# Patient Record
Sex: Female | Born: 1990 | Race: White | Hispanic: No | Marital: Single | Smoking: Never smoker
Health system: Southern US, Community
[De-identification: ages and names within clinical notes are randomized; demographics above are authoritative.]

## PROBLEM LIST (undated history)

## (undated) DIAGNOSIS — J329 Chronic sinusitis, unspecified: Secondary | ICD-10-CM

## (undated) DIAGNOSIS — T7840XA Allergy, unspecified, initial encounter: Secondary | ICD-10-CM

## (undated) DIAGNOSIS — F988 Other specified behavioral and emotional disorders with onset usually occurring in childhood and adolescence: Secondary | ICD-10-CM

## (undated) DIAGNOSIS — J45909 Unspecified asthma, uncomplicated: Secondary | ICD-10-CM

## (undated) DIAGNOSIS — R112 Nausea with vomiting, unspecified: Secondary | ICD-10-CM

## (undated) DIAGNOSIS — Z9889 Other specified postprocedural states: Secondary | ICD-10-CM

## (undated) DIAGNOSIS — E876 Hypokalemia: Secondary | ICD-10-CM

## (undated) HISTORY — DX: Unspecified asthma, uncomplicated: J45.909

## (undated) HISTORY — DX: Allergy, unspecified, initial encounter: T78.40XA

## (undated) HISTORY — DX: Other specified behavioral and emotional disorders with onset usually occurring in childhood and adolescence: F98.8

## (undated) HISTORY — PX: WISDOM TOOTH EXTRACTION: SHX21

## (undated) HISTORY — DX: Hypokalemia: E87.6

## (undated) HISTORY — PX: APPENDECTOMY: SHX54

---

## 2011-01-19 ENCOUNTER — Ambulatory Visit (INDEPENDENT_AMBULATORY_CARE_PROVIDER_SITE_OTHER): Payer: BC Managed Care – PPO

## 2011-01-19 DIAGNOSIS — F988 Other specified behavioral and emotional disorders with onset usually occurring in childhood and adolescence: Secondary | ICD-10-CM

## 2011-04-12 ENCOUNTER — Other Ambulatory Visit: Payer: Self-pay | Admitting: Family Medicine

## 2011-05-02 ENCOUNTER — Encounter: Payer: Self-pay | Admitting: Family Medicine

## 2011-05-02 ENCOUNTER — Ambulatory Visit (INDEPENDENT_AMBULATORY_CARE_PROVIDER_SITE_OTHER): Payer: BC Managed Care – PPO | Admitting: Family Medicine

## 2011-05-02 DIAGNOSIS — F988 Other specified behavioral and emotional disorders with onset usually occurring in childhood and adolescence: Secondary | ICD-10-CM

## 2011-05-02 DIAGNOSIS — J45909 Unspecified asthma, uncomplicated: Secondary | ICD-10-CM

## 2011-05-02 DIAGNOSIS — IMO0001 Reserved for inherently not codable concepts without codable children: Secondary | ICD-10-CM

## 2011-05-02 MED ORDER — ALBUTEROL SULFATE HFA 108 (90 BASE) MCG/ACT IN AERS: 1.0000 | INHALATION_SPRAY | Freq: Four times a day (QID) | RESPIRATORY_TRACT | Status: DC | PRN

## 2011-05-02 MED ORDER — FLUTICASONE-SALMETEROL 250-50 MCG/DOSE IN AEPB: 1.0000 | INHALATION_SPRAY | Freq: Two times a day (BID) | RESPIRATORY_TRACT | Status: DC

## 2011-05-02 MED ORDER — DROSPIRENONE-ETHINYL ESTRADIOL 3-0.02 MG PO TABS: 1.0000 | ORAL_TABLET | Freq: Every day | ORAL | Status: DC

## 2011-05-02 NOTE — Progress Notes (Signed)
  Subjective:    Patient ID: Sherry Estrada, female    DOB: Oct 27, 1990, 21 y.o.   MRN: [id number]  HPI  Patient presents for ADD follow up. No side effects. Denies appetite suppression, tics or emotional lability Helps with focus, attention and concentration  Finishing up with student teaching.  Has job interview tomorrow for Kindergarten position  Asthma- using Advair twice daily.  Recently with exercise intolerance.  Also has deveolped allergy symptoms.  Ear pressure, PND and cough.  Needs refill of OCP; In monogamous relationship  Review of Systems     Objective:   Physical Exam  Constitutional: She appears well-developed.  HENT:  Mouth/Throat: Oropharynx is clear and moist.  Neck: Neck supple. No thyromegaly present.  Cardiovascular: Normal rate, regular rhythm and normal heart sounds.   Pulmonary/Chest: Effort normal. Wheezes: prolong expiratory phase.  Lymphadenopathy:    She has no cervical adenopathy.  Neurological: She is alert.  Skin: Skin is warm.      Spirometry FEV1, FEV 25-75 normal    Assessment & Plan:   1. Asthma  Spirometry with graph, albuterol (PROVENTIL HFA;VENTOLIN HFA) 108 (90 BASE) MCG/ACT inhaler, Fluticasone-Salmeterol (ADVAIR DISKUS) 250-50 MCG/DOSE AEPB  2. Birth control  drospirenone-ethinyl estradiol (GIANVI) 3-0.02 MG tablet  3. ADD (attention deficit disorder)  Patient given 6 post dated prescriptions of Adderall  25 mg daily.  We will see her back in 6 months.  Prescriptions have been scanned into electronic record.

## 2011-05-17 ENCOUNTER — Other Ambulatory Visit: Payer: Self-pay | Admitting: Physician Assistant

## 2011-06-16 ENCOUNTER — Other Ambulatory Visit: Payer: Self-pay | Admitting: Physician Assistant

## 2011-07-05 ENCOUNTER — Ambulatory Visit (INDEPENDENT_AMBULATORY_CARE_PROVIDER_SITE_OTHER): Payer: BC Managed Care – PPO | Admitting: Emergency Medicine

## 2011-07-05 VITALS — BP 119/71 | HR 91 | Temp 98.0°F | Resp 18 | Ht 66.5 in | Wt 138.0 lb

## 2011-07-05 DIAGNOSIS — J309 Allergic rhinitis, unspecified: Secondary | ICD-10-CM

## 2011-07-05 MED ORDER — MOMETASONE FUROATE 50 MCG/ACT NA SUSP: 2.0000 | Freq: Every day | NASAL | Status: AC

## 2011-07-05 NOTE — Progress Notes (Signed)
  Subjective:    Patient ID: Sherry Estrada, female    DOB: January 28, 1991, 21 y.o.   MRN: [id number]  HPI Comments: Long history of SAR with acute nasal congestion unable to breath through nose.  No fever or chills.  No purulent discharge all clear     Review of Systems  Constitutional: Negative.   HENT: Positive for congestion, rhinorrhea and postnasal drip.   Eyes: Negative.   Respiratory: Negative.   Cardiovascular: Negative.   Musculoskeletal: Negative.   Neurological: Negative.        Objective:   Physical Exam  Constitutional: She is oriented to person, place, and time. She appears well-developed and well-nourished.  HENT:  Head: Normocephalic and atraumatic.  Right Ear: External ear normal.  Left Ear: External ear normal.  Nose: Mucosal edema and rhinorrhea present.  Eyes: Conjunctivae and EOM are normal. Pupils are equal, round, and reactive to light. Right eye exhibits no discharge. Left eye exhibits no discharge.  Neck: Normal range of motion. Neck supple.  Cardiovascular: Normal rate, regular rhythm and normal heart sounds.   Pulmonary/Chest: Effort normal and breath sounds normal.  Abdominal: Soft.  Musculoskeletal: Normal range of motion.  Neurological: She is alert and oriented to person, place, and time. She has normal reflexes.  Skin: Skin is warm.          Assessment & Plan:  Continue OTC allegra and start nasal steroid spray.  Avoid aftrin

## 2011-07-05 NOTE — Patient Instructions (Signed)
Allergic Rhinitis  Allergic rhinitis is when the mucous membranes in the nose respond to allergens. Allergens are particles in the air that cause your body to have an allergic reaction. This causes you to release allergic antibodies. Through a chain of events, these eventually cause you to release histamine into the blood stream (hence the use of antihistamines). Although meant to be protective to the body, it is this release that causes your discomfort, such as frequent sneezing, congestion and an itchy runny nose.    CAUSES    The pollen allergens may come from grasses, trees, and weeds. This is seasonal allergic rhinitis, or "hay fever." Other allergens cause year-round allergic rhinitis (perennial allergic rhinitis) such as house dust mite allergen, pet dander and mold spores.    SYMPTOMS     Nasal stuffiness (congestion).   Runny, itchy nose with sneezing and tearing of the eyes.   There is often an itching of the mouth, eyes and ears.  It cannot be cured, but it can be controlled with medications.  DIAGNOSIS    If you are unable to determine the offending allergen, skin or blood testing may find it.  TREATMENT     Avoid the allergen.   Medications and allergy shots (immunotherapy) can help.   Hay fever may often be treated with antihistamines in pill or nasal spray forms. Antihistamines block the effects of histamine. There are over-the-counter medicines that may help with nasal congestion and swelling around the eyes. Check with your caregiver before taking or giving this medicine.  If the treatment above does not work, there are many new medications your caregiver can prescribe. Stronger medications may be used if initial measures are ineffective. Desensitizing injections can be used if medications and avoidance fails. Desensitization is when a patient is given ongoing shots until the body becomes less sensitive to the allergen. Make sure you follow up with your caregiver if problems continue.  SEEK  MEDICAL CARE IF:     You develop fever (more than 100.5 F (38.1 C).   You develop a cough that does not stop easily (persistent).   You have shortness of breath.   You start wheezing.   Symptoms interfere with normal daily activities.  Document Released: 10/12/2000 Document Revised: 01/06/2011 Document Reviewed: 04/23/2008  ExitCare Patient Information 2012 [employer].

## 2011-08-23 ENCOUNTER — Encounter: Payer: Self-pay | Admitting: Family Medicine

## 2011-09-28 ENCOUNTER — Encounter: Payer: Self-pay | Admitting: Physician Assistant

## 2011-09-28 DIAGNOSIS — J309 Allergic rhinitis, unspecified: Secondary | ICD-10-CM | POA: Insufficient documentation

## 2011-11-06 ENCOUNTER — Ambulatory Visit (INDEPENDENT_AMBULATORY_CARE_PROVIDER_SITE_OTHER): Payer: BC Managed Care – PPO | Admitting: Family Medicine

## 2011-11-06 VITALS — BP 119/78 | HR 75 | Temp 98.0°F | Resp 16 | Ht 66.5 in | Wt 144.4 lb

## 2011-11-06 DIAGNOSIS — F988 Other specified behavioral and emotional disorders with onset usually occurring in childhood and adolescence: Secondary | ICD-10-CM | POA: Insufficient documentation

## 2011-11-06 MED ORDER — AMPHETAMINE-DEXTROAMPHET ER 25 MG PO CP24: 25.0000 mg | ORAL_CAPSULE | ORAL | Status: DC

## 2011-11-06 NOTE — Progress Notes (Signed)
 [facility]:  Office Visit  Chief Complaint:  Chief Complaint  Patient presents with  . Medication Refill    need Adderall    HPI: Sherry Estrada is a 21 y.o. female who complains of  ADD refills. Has been on same dose for 25 mg for the 2-3 years. 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 Doing well with focus and getting job done at this dose. No SEs. No weight loss, palpitations; denies anxiety/depression, SI/HI with this med  Past Medical History  Diagnosis Date  . ADD (attention deficit disorder)    Past Surgical History  Procedure Date  . Appendectomy    History   Social History  . Marital Status: Single    Spouse Name: N/A    Number of Children: N/A  . Years of Education: N/A   Social History Main Topics  . Smoking status: Never Smoker   . Smokeless tobacco: None  . Alcohol Use: Yes     ocas  . Drug Use: No  . Sexually Active: None   Other Topics Concern  . None   Social History Narrative  . None   Family History  Problem Relation Age of Onset  . Cancer Mother    No Known Allergies Prior to Admission medications   Medication Sig Start Date End Date Taking? Authorizing Provider  albuterol (PROVENTIL HFA;VENTOLIN HFA) 108 (90 BASE) MCG/ACT inhaler Inhale 1 puff into the lungs every 6 (six) hours as needed for wheezing. 05/02/11 05/01/12 Yes Dois Davenport, MD  amphetamine-dextroamphetamine (ADDERALL XR) 25 MG 24 hr capsule Take 25 mg by mouth every morning.   Yes Historical Provider, MD  drospirenone-ethinyl estradiol (GIANVI) 3-0.02 MG tablet Take 1 tablet by mouth daily. 05/02/11  Yes Dois Davenport, MD  EPINEPHrine (EPIPEN JR) 0.15 MG/0.3ML injection Inject 0.15 mg into the muscle as needed.   Yes Historical Provider, MD  Fluticasone-Salmeterol (ADVAIR DISKUS) 250-50 MCG/DOSE AEPB Inhale 1 puff into the lungs every 12 (twelve) hours. 05/02/11  Yes Dois Davenport, MD  mometasone (NASONEX) 50 MCG/ACT nasal spray Place 2 sprays into the nose  daily. 07/05/11 07/04/12 Yes Phillips Odor, MD     ROS: The patient denies fevers, chills, night sweats, unintentional weight loss, chest pain, palpitations, wheezing, dyspnea on exertion, nausea, vomiting, abdominal pain, dysuria, hematuria, melena, numbness, weakness, or tingling.   All other systems have been reviewed and were otherwise negative with the exception of those mentioned in the HPI and as above.    PHYSICAL EXAM: Filed Vitals:   11/06/11 0859  BP: 119/78  Pulse: 75  Temp: 98 F (36.7 C)  Resp: 16   Filed Vitals:   11/06/11 0859  Height: 5' 6.5" (1.689 m)  Weight: 144 lb 6.4 oz (65.499 kg)   Body mass index is 22.96 kg/(m^2).  General: Alert, no acute distress HEENT:  Normocephalic, atraumatic, oropharynx patent.  Cardiovascular:  Regular rate and rhythm, no rubs murmurs or gallops.  No Carotid bruits, radial pulse intact. No pedal edema.  Respiratory: Clear to auscultation bilaterally.  No wheezes, rales, or rhonchi.  No cyanosis, no use of accessory musculature GI: No organomegaly, abdomen is soft and non-tender, positive bowel sounds.  No masses. Skin: No rashes. Neurologic: Facial musculature symmetric. Psychiatric: Patient is appropriate throughout our interaction. Lymphatic: No cervical lymphadenopathy Musculoskeletal: Gait intact.   LABS: No results found for this or any previous visit.   EKG/XRAY:   Primary read interpreted by Dr. Conley Rolls at [facility].  ASSESSMENT/PLAN: Encounter Diagnosis  Name Primary?  . ADD (attention deficit disorder) Yes   Refilled Adderrall XR 25 mg daily for 10/6, 11/6, 12/6, 1/6, 2/6, and 3/6. #30. No refills.  F/u in 6 months.    ,  PHUONG, DO 11/06/2011 10:19 AM

## 2011-11-30 ENCOUNTER — Ambulatory Visit (INDEPENDENT_AMBULATORY_CARE_PROVIDER_SITE_OTHER): Payer: BC Managed Care – PPO | Admitting: Family Medicine

## 2011-11-30 VITALS — BP 126/74 | HR 97 | Temp 98.5°F | Resp 17 | Ht 66.5 in | Wt 146.0 lb

## 2011-11-30 DIAGNOSIS — M545 Low back pain: Secondary | ICD-10-CM

## 2011-11-30 MED ORDER — MELOXICAM 15 MG PO TABS: 15.0000 mg | ORAL_TABLET | Freq: Every day | ORAL | Status: DC

## 2011-11-30 MED ORDER — CYCLOBENZAPRINE HCL 5 MG PO TABS: 5.0000 mg | ORAL_TABLET | Freq: Three times a day (TID) | ORAL | Status: DC | PRN

## 2011-11-30 NOTE — Progress Notes (Addendum)
Subjective: Patient is coming in with 3 day history of back pain does not remember injury.  Patient does have PmHx of herniated disc multiple years ago when she did cheerleading/gymnastcis.  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  Seems that her low back is very tight at this time.  Hurt more with movement, call it some type of dull ache with sharp pain but no radiation, more on right side. No weakness or numbness in extremity, no weight loss fever or chills. No Dysuria and no hx of kidney stones.   Patient also needs a TB screening test for her employment while here. She is starting a new job working for [employer].    Objective: Blood pressure 126/74, pulse 97, temperature 98.5 F (36.9 C), temperature source Oral, resp. rate 17, height 5' 6.5" (1.689 m), weight 146 lb (66.225 kg), last menstrual period 11/23/2011, SpO2 98.00%. General: No apparent distress alert and oriented x3 mood and affect are normal Respiratory: Patient speaks in full sentences and does not appear short of breath Skin: Warm dry intact with no signs of infection or rash. Neuro: Cranial nerves II through XII intact or vascular intact in all extremities with 2+ DTRs are symmetric Back Exam: On inspection patient does have some mild dextroscoliosis of the thoracic spine. Patient has full flexion extension as well as side bending. Patient has a negative straight leg test bilaterally. Patient has a negative Faber's test bilaterally. Sensation is intact distally. Patient has 5 out of 5 strength of plantar flexion dorsiflexion as well as quadriceps and hamstrings bilaterally. Patient has tenderness to palpation over the paraspinal musculature of the lumbar spine near L4-L5 mostly on the right side. No spinous process tenderness. Gait is guarded but non-antalgic   Assessment: Low back pain secondary to muscle spasm.  Plan: Patient has what appears to be mechanical back pain  without radiculopathy. Patient has some pain over the L5 region but once again no radiculopathy with negative straight leg test and negative Faber's test. Patient is able to do activities of daily living without any trouble and has no red flags such as fevers chills or weight loss. Patient at this time will be treated conservatively with anti-inflammatories and muscle relaxants. Patient given a home exercise program to help with increasing range of motion as well as core strengthening. Patient will try this for 2 weeks and if no improvement will come back for further evaluation. At that time I would consider getting imaging to further evaluate. Patient does have a past medical history significant for herniated disc but this I agree that this is not a recurrent herniated disc.

## 2011-11-30 NOTE — Patient Instructions (Signed)
Very nice to meet you. I'm giving you to medicines. One is called meloxicam. Take one pill daily for the next 10 days as needed thereafter. If it starts to hurt your stomach please stop the medication. I'm also giving you a medicine called Flexeril. Take one pill up to 3 times a day for muscle spasm. Please do the exercises I'm giving you one to 2 times daily. If not better in 2 weeks please come back for reevaluation.

## 2011-11-30 NOTE — Progress Notes (Signed)
Tuberculosis Risk Questionnaire  1. Were you born outside the [place] in one of the following parts of the world:    [place], [place], [place], [place] America or Eastern [place]?  No  2. Have you traveled outside the [place] and lived for more than one month in one of the following parts of the world:  [place], [place], [place], [place] or Eastern [place]?  No  3. Do you have a compromised immune system such as from any of the following conditions:  HIV/AIDS, organ or bone marrow transplantation, diabetes, immunosuppressive   medicines (e.g. Prednisone, Remicaide), leukemia, lymphoma, cancer of the   head or neck, gastrectomy or jejunal bypass, end-stage renal disease (on   dialysis), or silicosis?  No    4. Have you ever done one of the following:    Used crack cocaine, injected illegal drugs, worked or resided in jail or prison,   worked or resided at a homeless shelter, or worked as a healthcare worker in   direct contact with patients?  No  5. Have you ever been exposed to anyone with infectious tuberculosis?  No Tuberculosis Symptom Questionnaire  Do you currently have any of the following symptoms?  1. Unexplained cough lasting more than 3 weeks? No  Unexplained fever lasting more than 3 weeks. No   3. Night Sweats (sweating that leaves the bedclothes and sheets wet)   No  4. Shortness of Breath No  5. Chest Pain No  6. Unintentional weight loss  No  7. Unexplained fatigue (very tired for no reason) No 

## 2011-12-07 ENCOUNTER — Telehealth: Payer: Self-pay

## 2011-12-07 NOTE — Telephone Encounter (Signed)
Patient has now found them.

## 2011-12-07 NOTE — Telephone Encounter (Signed)
pts mom cleaned her apartment and she cannot find the scripts (five of them to get them filled) She will continue to search , but needs a new prescription for amphetamine-dextroamphetamine (ADDERALL XR) 25 MG 24 hr capsule [phone]

## 2011-12-08 NOTE — Progress Notes (Signed)
Completed prior auth over the phone for pt's Adderall XR 25 and received approval from 11/17/11 to 12/07/12 case #[id number]. Faxed approval notice to pharmacy.

## 2012-05-03 ENCOUNTER — Telehealth: Payer: Self-pay

## 2012-05-03 ENCOUNTER — Other Ambulatory Visit: Payer: Self-pay | Admitting: Radiology

## 2012-05-03 DIAGNOSIS — J45909 Unspecified asthma, uncomplicated: Secondary | ICD-10-CM

## 2012-05-03 MED ORDER — FLUTICASONE-SALMETEROL 250-50 MCG/DOSE IN AEPB: 1.0000 | INHALATION_SPRAY | Freq: Two times a day (BID) | RESPIRATORY_TRACT | Status: DC

## 2012-05-03 NOTE — Telephone Encounter (Signed)
Pt's father requests RF of pt's Advair. Pt is due for f/up on ADD med in April anyway and she will D/W provider her asthma at OV. Sending in a RF to [facility]

## 2012-05-03 NOTE — Telephone Encounter (Signed)
rc'd fax from pharmacy for Advair, please advise, pended

## 2012-05-15 ENCOUNTER — Other Ambulatory Visit: Payer: Self-pay | Admitting: Allergy and Immunology

## 2012-05-15 ENCOUNTER — Ambulatory Visit
Admission: RE | Admit: 2012-05-15 | Discharge: 2012-05-15 | Disposition: A | Discharge status: Home / Self Care | Payer: BC Managed Care – PPO | Source: Ambulatory Visit | Attending: Allergy and Immunology | Admitting: Allergy and Immunology

## 2012-05-15 DIAGNOSIS — J019 Acute sinusitis, unspecified: Secondary | ICD-10-CM

## 2012-05-21 ENCOUNTER — Ambulatory Visit (INDEPENDENT_AMBULATORY_CARE_PROVIDER_SITE_OTHER): Payer: BC Managed Care – PPO | Admitting: Family Medicine

## 2012-05-21 VITALS — BP 112/70 | HR 60 | Temp 98.2°F | Resp 12 | Ht 66.5 in | Wt 143.0 lb

## 2012-05-21 DIAGNOSIS — IMO0001 Reserved for inherently not codable concepts without codable children: Secondary | ICD-10-CM

## 2012-05-21 DIAGNOSIS — J309 Allergic rhinitis, unspecified: Secondary | ICD-10-CM

## 2012-05-21 DIAGNOSIS — J45909 Unspecified asthma, uncomplicated: Secondary | ICD-10-CM

## 2012-05-21 DIAGNOSIS — Z309 Encounter for contraceptive management, unspecified: Secondary | ICD-10-CM

## 2012-05-21 DIAGNOSIS — F988 Other specified behavioral and emotional disorders with onset usually occurring in childhood and adolescence: Secondary | ICD-10-CM

## 2012-05-21 MED ORDER — AMPHETAMINE-DEXTROAMPHET ER 25 MG PO CP24: 25.0000 mg | ORAL_CAPSULE | ORAL | Status: DC

## 2012-05-21 MED ORDER — DROSPIRENONE-ETHINYL ESTRADIOL 3-0.02 MG PO TABS: 1.0000 | ORAL_TABLET | Freq: Every day | ORAL | Status: DC

## 2012-05-21 MED ORDER — AMPHETAMINE-DEXTROAMPHET ER 25 MG PO CP24
25.0000 mg | ORAL_CAPSULE | ORAL | Status: DC
Start: 2012-05-21 — End: 2012-05-21

## 2012-05-21 MED ORDER — FLUTICASONE-SALMETEROL 250-50 MCG/DOSE IN AEPB: 1.0000 | INHALATION_SPRAY | Freq: Two times a day (BID) | RESPIRATORY_TRACT | Status: DC

## 2012-05-21 NOTE — Progress Notes (Signed)
Sherry Estrada is a 22 y.o. female who presents to [facility] today with several concerns:  1.  ADHD:  Present since high school, has been on same dosage of medication since then.  Currently works as a Runner, broadcasting/film/video, states this helps her with her symptoms of inattentiveness and easy distractibility.  Can tell a difference when not using this medication.  No abdominal discomfort, chest pain, changes in weight with this medication.    2.  Allergic rhinitis:  Chronic problem.  Followed by allergist for this.  Recently diagnosed with chronic sinusitis secondary to CT scan findings and symptoms.  Started on antibiotic by her allergist with much improvement.  Currently still with some purulent drainage when she blows her nose and foul smell.  No fevers or chills.    3.  Contraception:  Desires refill for her birth control pills.  On this for contraception.  No leg swelling.  Sexually active one partner, no vaginal discharge, no concern for STI's.    4.  Asthma:  Controlled with Advair currently.  Only requires rescue inhaler 1-2 times every few weeks.  No night-time awakenings, no cough.  No dyspnea.   Not currently short of breath, not worse with seasonal allergies.  No chest pain, palpitations.  Never been hospitalized for this, no intubations.    PMH reviewed.  Past Medical History  Diagnosis Date  . ADD (attention deficit disorder)   . Allergy   . Asthma    Past Surgical History  Procedure Laterality Date  . Appendectomy      Medications reviewed. Current Outpatient Prescriptions  Medication Sig Dispense Refill  . amphetamine-dextroamphetamine (ADDERALL XR) 25 MG 24 hr capsule Take 1 capsule (25 mg total) by mouth every morning. Refill only on 04/06/2011  30 capsule  0  . azelastine (ASTELIN) 137 MCG/SPRAY nasal spray Place 1 spray into the nose 2 (two) times daily. Use in each nostril as directed      . drospirenone-ethinyl estradiol (GIANVI) 3-0.02 MG tablet Take 1 tablet by mouth daily.  28  tablet  11  . EPINEPHrine (EPIPEN JR) 0.15 MG/0.3ML injection Inject 0.15 mg into the muscle as needed.      . fluconazole (DIFLUCAN) 150 MG tablet Take 150 mg by mouth once.      . Fluticasone-Salmeterol (ADVAIR DISKUS) 250-50 MCG/DOSE AEPB Inhale 1 puff into the lungs every 12 (twelve) hours.  60 each  6  . levocetirizine (XYZAL) 5 MG tablet Take 5 mg by mouth every evening.      . moxifloxacin (AVELOX) 400 MG tablet Take 400 mg by mouth daily.      . predniSONE (DELTASONE) 10 MG tablet Take 10 mg by mouth daily.      Marland Kitchen albuterol (PROVENTIL HFA;VENTOLIN HFA) 108 (90 BASE) MCG/ACT inhaler Inhale 1 puff into the lungs every 6 (six) hours as needed for wheezing.  1 Inhaler  3  . cyclobenzaprine (FLEXERIL) 5 MG tablet Take 1 tablet (5 mg total) by mouth 3 (three) times daily as needed for muscle spasms.  30 tablet  1  . meloxicam (MOBIC) 15 MG tablet Take 1 tablet (15 mg total) by mouth daily.  30 tablet  0  . mometasone (NASONEX) 50 MCG/ACT nasal spray Place 2 sprays into the nose daily.  17 g  12   No current facility-administered medications for this visit.    ROS as above otherwise neg.  No chest pain, palpitations, SOB, Fever, Chills, Abd pain, N/V/D.   Physical Exam:  BP 112/70  Pulse 60  Temp(Src) 98.2 F (36.8 C) (Oral)  Resp 12  Ht 5' 6.5" (1.689 m)  Wt 143 lb (64.864 kg)  BMI 22.74 kg/m2  SpO2 98%  LMP 05/15/2012 Gen:  Patient sitting on exam table, appears stated age in no acute distress Head: Normocephalic atraumatic Eyes: EOMI, PERRL, sclera and conjunctiva non-erythematous Ears:  Canals clear bilaterally.  TMs pearly gray bilaterally without erythema or bulging.   Nose:  Nasal turbinates minimally enlarged.  Maxillary tenderness present BL. No foreign bodies or nasal polyps noted.   Mouth: Mucosa membranes moist. Tonsils +2, nonenlarged, non-erythematous. Neck: No cervical lymphadenopathy noted Heart:  RRR. Pulm:  Clear to auscultation bilaterally with good air  movement.  No wheezes or rales noted.   Psych:  Pleasant, converstant, not anxious or depressed appearing.     Assessment and Plan:  1.  Birth control:   Refill for present contraceptive.  Non smoker.  Doesn't miss pills.  2.  ADHD:  - Refill for Adderall provided for next 6 months.  She works at [employer] through summer as well.  3.  Chronic allergic rhinitis/sinusitis: - Followed by allergist.   - Already has an appt with ENT.   - Nothing acute currently, recommended FU with ENT.    4.  Ashthma: - Intermittent, controlled with Advair-  _ no recent exacerbations - Refill for advair sent, does not need rescue inhaler refills.

## 2012-10-24 ENCOUNTER — Encounter (HOSPITAL_BASED_OUTPATIENT_CLINIC_OR_DEPARTMENT_OTHER): Payer: Self-pay | Admitting: *Deleted

## 2012-10-29 ENCOUNTER — Encounter (HOSPITAL_BASED_OUTPATIENT_CLINIC_OR_DEPARTMENT_OTHER)
Admission: RE | Disposition: A | Discharge status: Home / Self Care | Payer: Self-pay | Source: Ambulatory Visit | Attending: Otolaryngology

## 2012-10-29 ENCOUNTER — Encounter (HOSPITAL_BASED_OUTPATIENT_CLINIC_OR_DEPARTMENT_OTHER): Payer: Self-pay | Admitting: Anesthesiology

## 2012-10-29 ENCOUNTER — Ambulatory Visit (HOSPITAL_BASED_OUTPATIENT_CLINIC_OR_DEPARTMENT_OTHER)
Admission: RE | Admit: 2012-10-29 | Discharge: 2012-10-29 | Disposition: A | Discharge status: Home / Self Care | Payer: BC Managed Care – PPO | Source: Ambulatory Visit | Attending: Otolaryngology | Admitting: Otolaryngology

## 2012-10-29 ENCOUNTER — Encounter (HOSPITAL_BASED_OUTPATIENT_CLINIC_OR_DEPARTMENT_OTHER): Payer: Self-pay | Admitting: *Deleted

## 2012-10-29 ENCOUNTER — Ambulatory Visit (HOSPITAL_BASED_OUTPATIENT_CLINIC_OR_DEPARTMENT_OTHER): Payer: BC Managed Care – PPO | Admitting: Anesthesiology

## 2012-10-29 DIAGNOSIS — J320 Chronic maxillary sinusitis: Secondary | ICD-10-CM | POA: Insufficient documentation

## 2012-10-29 DIAGNOSIS — Z9889 Other specified postprocedural states: Secondary | ICD-10-CM

## 2012-10-29 DIAGNOSIS — J322 Chronic ethmoidal sinusitis: Secondary | ICD-10-CM | POA: Insufficient documentation

## 2012-10-29 DIAGNOSIS — J343 Hypertrophy of nasal turbinates: Secondary | ICD-10-CM | POA: Insufficient documentation

## 2012-10-29 DIAGNOSIS — J338 Other polyp of sinus: Secondary | ICD-10-CM | POA: Insufficient documentation

## 2012-10-29 HISTORY — DX: Chronic sinusitis, unspecified: J32.9

## 2012-10-29 HISTORY — PX: TURBINATE REDUCTION: SHX6157

## 2012-10-29 HISTORY — DX: Other specified postprocedural states: Z98.890

## 2012-10-29 HISTORY — PX: NASAL SINUS SURGERY: SHX719

## 2012-10-29 HISTORY — DX: Nausea with vomiting, unspecified: R11.2

## 2012-10-29 SURGERY — REDUCTION, NASAL TURBINATE
Anesthesia: General | Site: Ear | Laterality: Bilateral | Wound class: Clean Contaminated

## 2012-10-29 MED ORDER — MIDAZOLAM HCL 2 MG/2ML IJ SOLN: 1.0000 mg | INTRAMUSCULAR | Status: DC | PRN

## 2012-10-29 MED ORDER — OXYCODONE-ACETAMINOPHEN 5-325 MG PO TABS: 1.0000 | ORAL_TABLET | ORAL | Status: DC | PRN

## 2012-10-29 MED ORDER — LIDOCAINE HCL (CARDIAC) 20 MG/ML IV SOLN
INTRAVENOUS | Status: DC | PRN
  Administered 2012-10-29: 50 mg via INTRAVENOUS

## 2012-10-29 MED ORDER — FENTANYL CITRATE 0.05 MG/ML IJ SOLN
INTRAMUSCULAR | Status: DC | PRN
  Administered 2012-10-29: 100 ug via INTRAVENOUS
  Administered 2012-10-29 (×3): 50 ug via INTRAVENOUS

## 2012-10-29 MED ORDER — FENTANYL CITRATE 0.05 MG/ML IJ SOLN: 50.0000 ug | INTRAMUSCULAR | Status: DC | PRN

## 2012-10-29 MED ORDER — ONDANSETRON HCL 4 MG/2ML IJ SOLN
INTRAMUSCULAR | Status: DC | PRN
  Administered 2012-10-29: 4 mg via INTRAVENOUS

## 2012-10-29 MED ORDER — AMOXICILLIN 875 MG PO TABS: 875.0000 mg | ORAL_TABLET | Freq: Two times a day (BID) | ORAL | Status: AC

## 2012-10-29 MED ORDER — DEXAMETHASONE SODIUM PHOSPHATE 4 MG/ML IJ SOLN
INTRAMUSCULAR | Status: DC | PRN
  Administered 2012-10-29: 10 mg via INTRAVENOUS

## 2012-10-29 MED ORDER — MIDAZOLAM HCL 5 MG/5ML IJ SOLN
INTRAMUSCULAR | Status: DC | PRN
  Administered 2012-10-29: 2 mg via INTRAVENOUS

## 2012-10-29 MED ORDER — PROPOFOL 10 MG/ML IV BOLUS
INTRAVENOUS | Status: DC | PRN
  Administered 2012-10-29: 150 mg via INTRAVENOUS

## 2012-10-29 MED ORDER — SUCCINYLCHOLINE CHLORIDE 20 MG/ML IJ SOLN
INTRAMUSCULAR | Status: DC | PRN
  Administered 2012-10-29: 100 mg via INTRAVENOUS

## 2012-10-29 MED ORDER — OXYCODONE HCL 5 MG/5ML PO SOLN: 5.0000 mg | Freq: Once | ORAL | Status: DC | PRN

## 2012-10-29 MED ORDER — COCAINE HCL 4 % EX SOLN
CUTANEOUS | Status: DC | PRN
  Administered 2012-10-29: 4 mL via NASAL

## 2012-10-29 MED ORDER — LACTATED RINGERS IV SOLN
INTRAVENOUS | Status: DC
  Administered 2012-10-29: 10:00:00 via INTRAVENOUS
  Administered 2012-10-29: 20 mL/h via INTRAVENOUS
  Administered 2012-10-29: 08:00:00 via INTRAVENOUS

## 2012-10-29 MED ORDER — OXYCODONE HCL 5 MG PO TABS: 5.0000 mg | ORAL_TABLET | Freq: Once | ORAL | Status: DC | PRN

## 2012-10-29 MED ORDER — OXYMETAZOLINE HCL 0.05 % NA SOLN
NASAL | Status: DC | PRN
  Administered 2012-10-29: 1 via NASAL

## 2012-10-29 MED ORDER — HYDROMORPHONE HCL PF 1 MG/ML IJ SOLN
0.2500 mg | INTRAMUSCULAR | Status: DC | PRN
  Administered 2012-10-29 (×3): 0.5 mg via INTRAVENOUS

## 2012-10-29 MED ORDER — CEFAZOLIN SODIUM-DEXTROSE 2-3 GM-% IV SOLR
INTRAVENOUS | Status: DC | PRN
  Administered 2012-10-29: 2 g via INTRAVENOUS

## 2012-10-29 MED ORDER — LIDOCAINE-EPINEPHRINE 1 %-1:100000 IJ SOLN
INTRAMUSCULAR | Status: DC | PRN
  Administered 2012-10-29: 6.5 mL

## 2012-10-29 MED ORDER — BACITRACIN ZINC 500 UNIT/GM EX OINT
TOPICAL_OINTMENT | CUTANEOUS | Status: DC | PRN
  Administered 2012-10-29: 1 via TOPICAL

## 2012-10-29 MED ORDER — PROMETHAZINE HCL 25 MG/ML IJ SOLN
6.2500 mg | INTRAMUSCULAR | Status: DC | PRN
  Administered 2012-10-29: 6.25 mg via INTRAVENOUS

## 2012-10-29 SURGICAL SUPPLY — 45 items
ATTRACTOMAT 16X20 MAGNETIC DRP (DRAPES) IMPLANT
BLADE RAD40 ROTATE 4M 4 5PK (BLADE) IMPLANT
BLADE RAD60 ROTATE M4 4 5PK (BLADE) IMPLANT
BLADE ROTATE RAD12 5PK M4 4MM (BLADE) IMPLANT
BLADE TRICUT ROTATE M4 4 5PK (BLADE) ×1 IMPLANT
BUR HS RAD FRONTAL 3 (BURR) IMPLANT
CANISTER SUC SOCK COL 7 IN (MISCELLANEOUS) ×4 IMPLANT
CANISTER SUCTION 1200CC (MISCELLANEOUS) ×2 IMPLANT
CLOTH BEACON ORANGE TIMEOUT ST (SAFETY) ×2 IMPLANT
COAGULATOR SUCT 8FR VV (MISCELLANEOUS) IMPLANT
COAGULATOR SUCT SWTCH 10FR 6 (ELECTROSURGICAL) IMPLANT
DECANTER SPIKE VIAL GLASS SM (MISCELLANEOUS) ×1 IMPLANT
DRSG NASAL KENNEDY LMNT 8CM (GAUZE/BANDAGES/DRESSINGS) IMPLANT
DRSG NASOPORE 8CM (GAUZE/BANDAGES/DRESSINGS) ×2 IMPLANT
DRSG TELFA 3X8 NADH (GAUZE/BANDAGES/DRESSINGS) IMPLANT
ELECT REM PT RETURN 9FT ADLT (ELECTROSURGICAL) ×2
ELECTRODE REM PT RTRN 9FT ADLT (ELECTROSURGICAL) ×1 IMPLANT
GLOVE BIO SURGEON STRL SZ7.5 (GLOVE) ×2 IMPLANT
GLOVE SURG SS PI 7.0 STRL IVOR (GLOVE) ×1 IMPLANT
GOWN PREVENTION PLUS XLARGE (GOWN DISPOSABLE) ×3 IMPLANT
HEMOSTAT SURGICEL 2X14 (HEMOSTASIS) IMPLANT
IV NS 500ML (IV SOLUTION) ×2
IV NS 500ML BAXH (IV SOLUTION) ×1 IMPLANT
NDL HYPO 25X1 1.5 SAFETY (NEEDLE) ×1 IMPLANT
NDL SPNL 25GX3.5 QUINCKE BL (NEEDLE) IMPLANT
NEEDLE 27GAX1X1/2 (NEEDLE) ×2 IMPLANT
NEEDLE HYPO 25X1 1.5 SAFETY (NEEDLE) IMPLANT
NEEDLE SPNL 25GX3.5 QUINCKE BL (NEEDLE) IMPLANT
NS IRRIG 1000ML POUR BTL (IV SOLUTION) ×1 IMPLANT
PACK BASIN DAY SURGERY FS (CUSTOM PROCEDURE TRAY) ×2 IMPLANT
PACK ENT DAY SURGERY (CUSTOM PROCEDURE TRAY) ×2 IMPLANT
PAD DRESSING TELFA 3X8 NADH (GAUZE/BANDAGES/DRESSINGS) IMPLANT
SET EXT MALE ROTATING LL 32IN (MISCELLANEOUS) IMPLANT
SET IV EXT TUBING FEMALE 31 (MISCELLANEOUS) IMPLANT
SLEEVE SCD COMPRESS KNEE MED (MISCELLANEOUS) ×1 IMPLANT
SOLUTION BUTLER CLEAR DIP (MISCELLANEOUS) ×3 IMPLANT
SPLINT NASAL DOYLE BI-VL (GAUZE/BANDAGES/DRESSINGS) IMPLANT
SPONGE GAUZE 2X2 8PLY STRL LF (GAUZE/BANDAGES/DRESSINGS) ×2 IMPLANT
SPONGE NEURO XRAY DETECT 1X3 (DISPOSABLE) ×2 IMPLANT
SUT ETHILON 3 0 PS 1 (SUTURE) IMPLANT
SUT PLAIN 4 0 ~~LOC~~ 1 (SUTURE) IMPLANT
SUT PROLENE 3 0 PS 2 (SUTURE) IMPLANT
TOWEL OR 17X24 6PK STRL BLUE (TOWEL DISPOSABLE) ×2 IMPLANT
TUBE CONNECTING 20X1/4 (TUBING) ×2 IMPLANT
YANKAUER SUCT BULB TIP NO VENT (SUCTIONS) ×1 IMPLANT

## 2012-10-29 NOTE — Anesthesia Preprocedure Evaluation (Addendum)
Anesthesia Evaluation  Patient identified by MRN, date of birth, ID band Patient awake    Reviewed: Allergy & Precautions, H&P , NPO status   History of Anesthesia Complications (+) PONV  Airway Mallampati: I      Dental  (+) Teeth Intact   Pulmonary asthma ,  breath sounds clear to auscultation        Cardiovascular negative cardio ROS  Rhythm:Regular Rate:Normal     Neuro/Psych    GI/Hepatic negative GI ROS, Neg liver ROS,   Endo/Other  negative endocrine ROS  Renal/GU negative Renal ROS     Musculoskeletal negative musculoskeletal ROS (+)   Abdominal   Peds  Hematology negative hematology ROS (+)   Anesthesia Other Findings   Reproductive/Obstetrics                          Anesthesia Physical Anesthesia Plan  ASA: II  Anesthesia Plan: General   Post-op Pain Management:    Induction: Intravenous  Airway Management Planned: Oral ETT  Additional Equipment:   Intra-op Plan:   Post-operative Plan: Extubation in OR  Informed Consent: I have reviewed the patients History and Physical, chart, labs and discussed the procedure including the risks, benefits and alternatives for the proposed anesthesia with the patient or authorized representative who has indicated his/her understanding and acceptance.   Dental advisory given  Plan Discussed with: CRNA and Surgeon  Anesthesia Plan Comments:         Anesthesia Quick Evaluation

## 2012-10-29 NOTE — H&P (Signed)
  H&P Update  Pt's original H&P dated 10/04/12 reviewed and placed in chart (to be scanned).  I personally examined the patient today.  No change in health. Proceed with bilateral endoscopic sinus surgery and turbinate reduction.

## 2012-10-29 NOTE — Transfer of Care (Signed)
Immediate Anesthesia Transfer of Care Note  Patient: Sherry Estrada  Procedure(s) Performed: Procedure(s): BILATERAL ENDOSCOPIC TOTAL ETHMOIDECTOMY/ BILATERAL MAXILLARY ANTROSTOMY  (Bilateral) BILATERAL ENDOSCOPIC SINUS SURGERY/BILATERAL PARTIAL INFERIOR TURBINATE RESECTION (Bilateral)  Patient Location: PACU  Anesthesia Type:General  Level of Consciousness: awake  Airway & Oxygen Therapy: Patient Spontanous Breathing and Patient connected to face mask oxygen  Post-op Assessment: Report given to PACU RN and Post -op Vital signs reviewed and stable  Post vital signs: Reviewed and stable  Complications: No apparent anesthesia complications

## 2012-10-29 NOTE — Brief Op Note (Signed)
10/29/2012  10:57 AM  PATIENT:  Sherry Estrada  22 y.o. female  PRE-OPERATIVE DIAGNOSIS:  BILATERAL CHRONIC SINUSITIS/BILATERAL INFERIOR TURBINATE HYPERTROPHY  POST-OPERATIVE DIAGNOSIS:  BILATERAL CHRONIC SINUSITIS/BILATERAL INFERIOR TURBINATE HYPERTROPHY  PROCEDURE:  Procedure(s): BILATERAL ENDOSCOPIC TOTAL ETHMOIDECTOMY BILATERAL ENDOSCOPIC MAXILLARY ANTROSTOMY WITH POLYP REMOVAL BILATERAL PARTIAL INFERIOR TURBINATE RESECTION  SURGEON:  Surgeon(s) and Role:    * Darletta Moll, MD - Primary  PHYSICIAN ASSISTANT:   ASSISTANTS: none   ANESTHESIA:   general  EBL:  Total I/O In: 1600 [I.V.:1600] Out: 475 [Blood:475]  BLOOD ADMINISTERED:none  DRAINS: none   LOCAL MEDICATIONS USED:  LIDOCAINE  and OTHER 4% Cocaine  SPECIMEN:  Source of Specimen:  Bilateral sinus contents  DISPOSITION OF SPECIMEN:  PATHOLOGY  COUNTS:  YES  TOURNIQUET:  * No tourniquets in log *  DICTATION: .Other Dictation: Dictation Number [id number]  PLAN OF CARE: Discharge to home after PACU  PATIENT DISPOSITION:  PACU - hemodynamically stable.   Delay start of Pharmacological VTE agent (>24hrs) due to surgical blood loss or risk of bleeding: not applicable

## 2012-10-29 NOTE — Anesthesia Postprocedure Evaluation (Signed)
  Anesthesia Post-op Note  Patient: Sherry Estrada  Procedure(s) Performed: Procedure(s): BILATERAL ENDOSCOPIC TOTAL ETHMOIDECTOMY/ BILATERAL MAXILLARY ANTROSTOMY  (Bilateral) BILATERAL ENDOSCOPIC SINUS SURGERY/BILATERAL PARTIAL INFERIOR TURBINATE RESECTION (Bilateral)  Patient Location: PACU  Anesthesia Type:General  Level of Consciousness: awake  Airway and Oxygen Therapy: Patient Spontanous Breathing  Post-op Pain: mild  Post-op Assessment: Post-op Vital signs reviewed, Patient's Cardiovascular Status Stable, Respiratory Function Stable, Patent Airway, No signs of Nausea or vomiting and Pain level controlled  Post-op Vital Signs: Reviewed and stable  Complications: No apparent anesthesia complications

## 2012-10-29 NOTE — Anesthesia Procedure Notes (Signed)
Procedure Name: Intubation Date/Time: 10/29/2012 8:40 AM Performed by: Caren Macadam Pre-anesthesia Checklist: Patient identified, Emergency Drugs available, Suction available and Patient being monitored Patient Re-evaluated:Patient Re-evaluated prior to inductionOxygen Delivery Method: Circle System Utilized Preoxygenation: Pre-oxygenation with 100% oxygen Intubation Type: IV induction Ventilation: Mask ventilation without difficulty Laryngoscope Size: Miller and 2 Grade View: Grade I Tube type: Oral Tube size: 6.5 mm Number of attempts: 1 Airway Equipment and Method: stylet and oral airway Placement Confirmation: ETT inserted through vocal cords under direct vision,  positive ETCO2 and breath sounds checked- equal and bilateral Secured at: 18 cm Tube secured with: Tape Dental Injury: Teeth and Oropharynx as per pre-operative assessment

## 2012-10-30 ENCOUNTER — Encounter (HOSPITAL_BASED_OUTPATIENT_CLINIC_OR_DEPARTMENT_OTHER): Payer: Self-pay | Admitting: Otolaryngology

## 2012-10-30 NOTE — Op Note (Signed)
Sherry Estrada, Sherry Estrada NO.:  [id number]  MEDICAL RECORD NO.:  [id number]  LOCATION:                                 FACILITY:  PHYSICIAN:  Newman Pies, MD                 DATE OF BIRTH:  DATE OF PROCEDURE:  10/29/2012 DATE OF DISCHARGE:                              OPERATIVE REPORT   SURGEON:  Newman Pies, MD  PREOPERATIVE DIAGNOSES: 1. Bilateral chronic maxillary sinusitis. 2. Bilateral chronic ethmoid sinusitis. 3. Bilateral inferior turbinate hypertrophy.  POSTOPERATIVE DIAGNOSES: 1. Bilateral chronic maxillary sinusitis. 2. Bilateral chronic ethmoid sinusitis. 3. Bilateral inferior turbinate hypertrophy.  PROCEDURES PERFORMED: 1. Bilateral endoscopic total ethmoidectomy. 2. Bilateral endoscopic maxillary antrostomy with polyp removal. 3. Bilateral partial inferior turbinate resection.  ANESTHESIA:  General endotracheal tube anesthesia.  COMPLICATIONS:  None.  ESTIMATED BLOOD LOSS:  450 mL.  INDICATION FOR PROCEDURE:  The patient is a 22 year old female with a history of chronic sinusitis for the past year.  The patient was previously treated with multiple courses of antibiotics, weekly allergy shots, Dymista nasal sprays, over-the-counter decongestant, antihistamine, and steroid injections.  However, she continues to be symptomatic.  The patient complains of chronic nasal obstruction bilaterally.  She underwent a paranasal sinus CT scan recently.  It showed near complete opacification of the left maxillary sinus and significant mucosal thickening of the right maxillary sinus and bilateral ethmoid sinuses.  Her inferior turbinates were noted to be severely hypertrophied.  Based on the above findings, the decision was made for the patient to undergo the above-stated procedures.  The risks, benefits, alternatives, and details of the procedures were discussed with the patient.  Questions were invited and answered.  Informed consent was  obtained.  DESCRIPTION OF PROCEDURE:  The patient was taken to the operating room and placed supine on the operating table.  General endotracheal tube anesthesia was administered by the anesthesiologist.  The patient was positioned and prepped and draped in a standard fashion for nasal surgery.  Pledgets soaked with Afrin were placed in both nasal cavities. The pledgets were subsequently removed.  Endoscopic evaluation on both nasal cavity still showed significant soft tissue edema bilaterally.  As a result, pledgets were soaked with 4% cocaine, were also placed in the nasal cavity.  The pledgets were subsequently removed.  Attention was first focused on the left side.  Under the endoscopic guidance, the middle turbinate was carefully medialized.  The uncinate process was then resected with a Therapist, nutritional.  The maxillary antrum was then enlarged with a combination of backbiter, Tru-Cut forceps, and microdebrider.  A large amount of polypoid tissue was also removed from the left maxillary sinus.  The anterior and posterior ethmoid cavities were then entered with a suction catheter.  Polypoid tissue and edematous mucosa were then removed with a combination of Tru-Cut forceps and microdebrider.  Attention was then focused on the inferior turbinate.  The inferior turbinate was noted to be severely hypertrophied.  The inferior one-half of the inferior turbinate was then crossclamped with a straight Allis clamp.  The inferior one-half of the inferior turbinate was then resected with a  pair of crosscutting scissors and Tru-Cut forceps. Hemostasis was achieved with the suction electrocautery.  NASOPORE nasal dressing was then applied to the middle meatus and to the inferior turbinate.  The same procedure was then repeated on the right side without exceptions.  Polypoid tissue was also noted within the right maxillary and ethmoid sinuses.  The care of the patient was turned over to  the anesthesiologist.  The patient was awakened from anesthesia without difficulty.  She was extubated and transferred to the recovery room in good condition.  OPERATIVE FINDINGS:  Polypoid tissue was noted to occupied the maxillary and ethmoid sinuses.  Both inferior turbinates were severely hypertrophied.  SPECIMEN:  Bilateral sinus contents.  FOLLOWUP CARE:  The patient will be discharged home once she is awake and alert.  She will be placed on Percocet 1-2 tablets p.o. q.4 hours p.r.n. pain and amoxicillin 875 mg p.o. b.i.d. for 5 days.  The patient will follow up in my office in 5 days.     Newman Pies, MD     ST/MEDQ  D:  10/29/2012  T:  10/30/2012  Job:  [id number]  cc:   Eileen Stanford, MD

## 2012-11-19 ENCOUNTER — Ambulatory Visit (INDEPENDENT_AMBULATORY_CARE_PROVIDER_SITE_OTHER): Payer: BC Managed Care – PPO | Admitting: Physician Assistant

## 2012-11-19 VITALS — BP 110/78 | HR 120 | Temp 98.1°F | Resp 16 | Ht 66.0 in | Wt 146.0 lb

## 2012-11-19 DIAGNOSIS — F988 Other specified behavioral and emotional disorders with onset usually occurring in childhood and adolescence: Secondary | ICD-10-CM

## 2012-11-19 DIAGNOSIS — J45909 Unspecified asthma, uncomplicated: Secondary | ICD-10-CM

## 2012-11-19 MED ORDER — AMPHETAMINE-DEXTROAMPHET ER 25 MG PO CP24: 25.0000 mg | ORAL_CAPSULE | ORAL | Status: DC

## 2012-11-19 MED ORDER — ALBUTEROL SULFATE HFA 108 (90 BASE) MCG/ACT IN AERS: 1.0000 | INHALATION_SPRAY | Freq: Four times a day (QID) | RESPIRATORY_TRACT | Status: DC | PRN

## 2012-11-19 NOTE — Progress Notes (Signed)
I have examined this patient along with the student and agree.  RTC 6 months. May call for prescriptions in 3 months.

## 2012-11-19 NOTE — Patient Instructions (Signed)
[facility] Policy for Prescribing Controlled Substances (Revised 12/2011) 1. Prescriptions for controlled substances will be filled by ONE provider at [facility] with whom you have established and developed a plan for your care, including follow-up. 2. You are encouraged to schedule an appointment with your prescriber at our appointment center for follow-up visits whenever possible. 3. If you request a prescription for the controlled substance while at [facility] for an acute problem (with someone other than your regular prescriber), you MAY be given a ONE-TIME prescription for a 30-day supply of the controlled substance, to allow time for you to return to see your regular prescriber for additional prescriptions. 

## 2012-11-19 NOTE — Progress Notes (Signed)
  Subjective:    Patient ID: Sherry Estrada, female    DOB: Sep 14, 1990, 22 y.o.   MRN: [id number]  HPI Ms. Sherry Estrada is a 22 YO female who presents for ADD follow-up.  She was diagnosed with ADD when she was in highschool due to having trouble focusing in school and placed on Adderall at that time.  She is currently taking Adderall XR 25 mg and states that her ADD is stable and she is having no problems. 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  She taker her medication after breakfast each morning around 730 AM and states that its effects wear off around 3-4pm, at which time she is done with work.  She takes her medication year round except for weekends, in which she has noticed feeling more tired than she is during the week. She is content with her current dose of Adderall and does not want to change anything about her ADD treatment. She denies any  fidgeting or palpitations.   The patient also has a history of Asthma in which she takes Advair, Singulair, and albuterol as needed.  She denies needing her Albuterol recently and states her asthma is stable.  She denies wheezing or shortness of breath.     Review of Systems     Objective:   Physical Exam BP 110/78  Pulse 120  Temp(Src) 98.1 F (36.7 C) (Oral)  Resp 16  Ht 5\' 6"  (1.676 m)  Wt 146 lb (66.225 kg)  BMI 23.58 kg/m2  SpO2 100%  LMP 11/14/2012  Sherry Estrada is a pleasant well-developed, well-nourished female in no acute distress Heart: Normal rate and rhythm, no murmurs, rubs, or gallops Lungs: Clear to auscultation bilaterally, no wheezing, rhonchi, or rubs       Assessment & Plan:  ADD (attention deficit disorder)  -Continue current plan: -Plan: Refill-amphetamine-dextroamphetamine (ADDERALL XR) 25 MG 24 hr capsule  Asthma Unspecified asthma(493.90) -Plan: Continue current plan -Refill albuterol (PROVENTIL HFA;VENTOLIN HFA) 108 (90 BASE) MCG/ACT  inhaler

## 2012-12-20 NOTE — Progress Notes (Signed)
PA approved for Adderall XR 25 mg capsules through 12/20/13, case # [id number]. Pharm notified.

## 2013-01-14 ENCOUNTER — Other Ambulatory Visit: Payer: Self-pay | Admitting: Physician Assistant

## 2013-02-19 ENCOUNTER — Telehealth: Payer: Self-pay

## 2013-02-19 DIAGNOSIS — F988 Other specified behavioral and emotional disorders with onset usually occurring in childhood and adolescence: Secondary | ICD-10-CM

## 2013-02-19 MED ORDER — AMPHETAMINE-DEXTROAMPHET ER 25 MG PO CP24: 25.0000 mg | ORAL_CAPSULE | ORAL | Status: DC

## 2013-02-19 NOTE — Telephone Encounter (Signed)
Rx's printed.  Meds ordered this encounter  Medications  . amphetamine-dextroamphetamine (ADDERALL XR) 25 MG 24 hr capsule    Sig: Take 1 capsule (25 mg total) by mouth every morning.    Dispense:  30 capsule    Refill:  0    Order Specific Question:  Supervising Provider    Answer:  DOOLITTLE, ROBERT P [[id number]]  . amphetamine-dextroamphetamine (ADDERALL XR) 25 MG 24 hr capsule    Sig: Take 1 capsule (25 mg total) by mouth every morning. May fill 30 days after date on prescription.    Dispense:  30 capsule    Refill:  0    Order Specific Question:  Supervising Provider    Answer:  DOOLITTLE, ROBERT P [[id number]]  . amphetamine-dextroamphetamine (ADDERALL XR) 25 MG 24 hr capsule    Sig: Take 1 capsule (25 mg total) by mouth every morning. May fill 60 days after date on prescription.    Dispense:  30 capsule    Refill:  0    Order Specific Question:  Supervising Provider    Answer:  DOOLITTLE, ROBERT P [[id number]]

## 2013-02-19 NOTE — Telephone Encounter (Signed)
PT IN NEED OF HER 3 MONTH ADDERALL. PLEASE CALL [phone] WHEN READY FOR PICK UP

## 2013-02-20 NOTE — Telephone Encounter (Signed)
Advised pt rx in p/u drawer

## 2013-05-21 ENCOUNTER — Ambulatory Visit (INDEPENDENT_AMBULATORY_CARE_PROVIDER_SITE_OTHER): Payer: BC Managed Care – PPO | Admitting: Family Medicine

## 2013-05-21 VITALS — BP 106/74 | HR 76 | Temp 97.9°F | Resp 16 | Ht 66.0 in | Wt 142.0 lb

## 2013-05-21 DIAGNOSIS — F988 Other specified behavioral and emotional disorders with onset usually occurring in childhood and adolescence: Secondary | ICD-10-CM

## 2013-05-21 MED ORDER — AMPHETAMINE-DEXTROAMPHET ER 25 MG PO CP24: 25.0000 mg | ORAL_CAPSULE | ORAL | Status: DC

## 2013-05-21 MED ORDER — AMPHETAMINE-DEXTROAMPHET ER 25 MG PO CP24
25.0000 mg | ORAL_CAPSULE | ORAL | Status: DC
Start: 2013-05-21 — End: 2013-08-22

## 2013-05-21 NOTE — Progress Notes (Signed)
[facility] [address], [place] [id number] [phone]  Date:  05/21/2013   Name:  Sherry MccoyBrooke A Estrada   DOB:  1990-09-01   MRN:  [id number]  PCP:  JEFFERY,CHELLE, PA-C    Chief Complaint: Medication Refill   History of Present Illness:  Sherry MccoyBrooke A Sherry Estrada is a 23 y.o. very pleasant female patient who presents with the following:  Here today for a 6 month recheck of her ADD.  HPI from [place] from 10/2012: Ms. Sherry SettleBrooke is a 23 YO female who presents for ADD follow-up. She was diagnosed with ADD when she was in highschool due to having trouble focusing in school and placed on Adderall at that time. She is currently taking Adderall XR 25 mg and states that her ADD is stable and she is having no problems. 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 She taker her medication after breakfast each morning around 730 AM and states that its effects wear off around 3-4pm, at which time she is done with work. She takes her medication year round except for weekends, in which she has noticed feeling more tired than she is during the week. She is content with her current dose of Adderall and does not want to change anything about her ADD treatment. She denies any fidgeting or palpitations.   She continues to do well with her current therapy.  She plans to take some time off this summer to relax and is looking forward to this break.  School continues to go well, she is enjoying teaching.  LMP was a week ago. Appetite and sleep are ok.   Patient Active Problem List   Diagnosis Date Noted  . ADD (attention deficit disorder) 11/06/2011  . Allergic rhinitis 09/28/2011  . Asthma 05/02/2011    Past Medical History  Diagnosis Date  . ADD (attention deficit disorder)   . Allergy   . Asthma   . PONV (postoperative nausea and vomiting)   . Chronic sinusitis     Past Surgical History  Procedure Laterality Date   . Appendectomy    . Wisdom tooth extraction    . Turbinate reduction Bilateral 10/29/2012    Procedure: BILATERAL ENDOSCOPIC TOTAL ETHMOIDECTOMY/ BILATERAL MAXILLARY ANTROSTOMY ;  Surgeon: Darletta MollSui W Teoh, MD;  Location: [facility];  Service: ENT;  Laterality: Bilateral;  . Nasal sinus surgery Bilateral 10/29/2012    Procedure: BILATERAL ENDOSCOPIC SINUS SURGERY/BILATERAL PARTIAL INFERIOR TURBINATE RESECTION;  Surgeon: Darletta MollSui W Teoh, MD;  Location: [facility];  Service: ENT;  Laterality: Bilateral;    History  Substance Use Topics  . Smoking status: Never Smoker   . Smokeless tobacco: Never Used  . Alcohol Use: 0.0 - 1.0 oz/week    0-2 drink(s) per week     Comment: once every 3 months    Family History  Problem Relation Age of Onset  . Cancer Mother   . Cancer Maternal Grandfather   . Heart disease Paternal Grandfather     No Known Allergies  Medication list has been reviewed and updated.  Current Outpatient Prescriptions on File Prior to Visit  Medication Sig Dispense Refill  . amphetamine-dextroamphetamine (ADDERALL XR) 25 MG 24 hr capsule Take 1 capsule (25 mg total) by mouth every morning.  30 capsule  0  . amphetamine-dextroamphetamine (ADDERALL XR) 25 MG 24 hr capsule Take 1 capsule (25 mg total) by  mouth every morning. May fill 30 days after date on prescription.  30 capsule  0  . amphetamine-dextroamphetamine (ADDERALL XR) 25 MG 24 hr capsule Take 1 capsule (25 mg total) by mouth every morning. May fill 60 days after date on prescription.  30 capsule  0  . azelastine (ASTELIN) 137 MCG/SPRAY nasal spray Place 1 spray into the nose 2 (two) times daily. Use in each nostril as directed      . drospirenone-ethinyl estradiol (GIANVI) 3-0.02 MG tablet Take 1 tablet by mouth daily.  28 tablet  11  . EPINEPHrine (EPIPEN JR) 0.15 MG/0.3ML injection Inject 0.15 mg into the muscle as needed.      . Fluticasone-Salmeterol (ADVAIR DISKUS) 250-50 MCG/DOSE AEPB  Inhale 1 puff into the lungs every 12 (twelve) hours.  60 each  6  . levocetirizine (XYZAL) 5 MG tablet Take 5 mg by mouth every evening.      . montelukast (SINGULAIR) 10 MG tablet Take 10 mg by mouth at bedtime.      Marland Kitchen. albuterol (PROVENTIL HFA;VENTOLIN HFA) 108 (90 BASE) MCG/ACT inhaler Inhale 1 puff into the lungs every 6 (six) hours as needed for wheezing.  1 Inhaler  3   No current facility-administered medications on file prior to visit.    Review of Systems:  As per HPI- otherwise negative.   Physical Examination: Filed Vitals:   05/21/13 0839  BP: 106/74  Pulse: 76  Temp: 97.9 F (36.6 C)  Resp: 16   Filed Vitals:   05/21/13 0839  Height: 5\' 6"  (1.676 m)  Weight: 142 lb (64.411 kg)   Body mass index is 22.93 kg/(m^2). Ideal Body Weight: Weight in (lb) to have BMI = 25: 154.6  GEN: WDWN, NAD, Non-toxic, A & O x 3, looks well HEENT: Atraumatic, Normocephalic. Neck supple. No masses, No LAD. Ears and Nose: No external deformity. CV: RRR, No M/G/R. No JVD. No thrill. No extra heart sounds. PULM: CTA B, no wheezes, crackles, rhonchi. No retractions. No resp. distress. No accessory muscle use. EXTR: No c/c/e NEURO Normal gait.  PSYCH: Normally interactive. Conversant. Not depressed or anxious appearing.  Calm demeanor.   Wt Readings from Last 3 Encounters:  05/21/13 142 lb (64.411 kg)  11/19/12 146 lb (66.225 kg)  10/29/12 147 lb 8 oz (66.906 kg)     Assessment and Plan: ADD (attention deficit disorder) - Plan: amphetamine-dextroamphetamine (ADDERALL XR) 25 MG 24 hr capsule, amphetamine-dextroamphetamine (ADDERALL XR) 25 MG 24 hr capsule, amphetamine-dextroamphetamine (ADDERALL XR) 25 MG 24 hr capsule  Refilled her adderall for April, May and June.  She will let us know when she needs more in about 3 months.  Doing well, continue same dose.    Signed Abbe AmsterdamJessica Sadie Hazelett, MD

## 2013-05-21 NOTE — Patient Instructions (Signed)
Great to see you today- give us a call or mychart message in about 3 months when you are due for more medication.

## 2013-05-27 ENCOUNTER — Telehealth: Payer: Self-pay

## 2013-05-27 DIAGNOSIS — IMO0001 Reserved for inherently not codable concepts without codable children: Secondary | ICD-10-CM

## 2013-05-27 MED ORDER — DROSPIRENONE-ETHINYL ESTRADIOL 3-0.02 MG PO TABS: 1.0000 | ORAL_TABLET | Freq: Every day | ORAL | Status: DC

## 2013-05-27 NOTE — Telephone Encounter (Signed)
[facility] [address] faxed req for [employer] [place]. Sent in 1 RF w/note to RTC.

## 2013-07-31 ENCOUNTER — Other Ambulatory Visit: Payer: Self-pay

## 2013-07-31 DIAGNOSIS — Z30011 Encounter for initial prescription of contraceptive pills: Secondary | ICD-10-CM

## 2013-07-31 MED ORDER — DROSPIRENONE-ETHINYL ESTRADIOL 3-0.02 MG PO TABS: 1.0000 | ORAL_TABLET | Freq: Every day | ORAL | Status: DC

## 2013-07-31 NOTE — Telephone Encounter (Signed)
Pt is needing a refill on drospirenone-ethinyl estradiol (GIANVI) 3-0.02 MG tablet [[id number]][[id number]], pharmacy said that she is out of refills, and needs to be seen by her dr. Rock NephewPt is leaving for [place] tomorrow, and would like to know if she can have one aditional prescription, and see her Dr. When she returns for [place] in a week ( she is a Pt of [employer]le Jeffery)

## 2013-08-20 ENCOUNTER — Telehealth: Payer: Self-pay

## 2013-08-20 DIAGNOSIS — F988 Other specified behavioral and emotional disorders with onset usually occurring in childhood and adolescence: Secondary | ICD-10-CM

## 2013-08-20 NOTE — Telephone Encounter (Signed)
Patient requesting refill on her Adderral Medication, please call patient when ready to be picked up at [phone]

## 2013-08-22 MED ORDER — AMPHETAMINE-DEXTROAMPHET ER 25 MG PO CP24: 25.0000 mg | ORAL_CAPSULE | ORAL | Status: DC

## 2013-08-22 NOTE — Telephone Encounter (Signed)
Came in and did her rx today- called and let her knw

## 2013-08-22 NOTE — Telephone Encounter (Signed)
Called and LMOM- I am so sorry that I just got this message.  Would it be ok to pick up her adderall tomorrow when I am in the office? If needed today and I can come in and do refill for her

## 2013-08-22 NOTE — Telephone Encounter (Signed)
Pt called in regarding prescription refill for Adderral done on 7/21, the message was not routed. Please call pt when this is ready

## 2013-09-14 ENCOUNTER — Other Ambulatory Visit: Payer: Self-pay | Admitting: Physician Assistant

## 2013-11-24 ENCOUNTER — Ambulatory Visit (INDEPENDENT_AMBULATORY_CARE_PROVIDER_SITE_OTHER): Payer: BC Managed Care – PPO | Admitting: Internal Medicine

## 2013-11-24 VITALS — BP 112/78 | HR 88 | Temp 99.5°F | Resp 19 | Ht 66.5 in | Wt 135.4 lb

## 2013-11-24 DIAGNOSIS — J302 Other seasonal allergic rhinitis: Secondary | ICD-10-CM

## 2013-11-24 DIAGNOSIS — J4530 Mild persistent asthma, uncomplicated: Secondary | ICD-10-CM

## 2013-11-24 DIAGNOSIS — F988 Other specified behavioral and emotional disorders with onset usually occurring in childhood and adolescence: Secondary | ICD-10-CM

## 2013-11-24 DIAGNOSIS — F909 Attention-deficit hyperactivity disorder, unspecified type: Secondary | ICD-10-CM

## 2013-11-24 MED ORDER — MONTELUKAST SODIUM 10 MG PO TABS: 10.0000 mg | ORAL_TABLET | Freq: Every day | ORAL | Status: DC

## 2013-11-24 MED ORDER — AMPHETAMINE-DEXTROAMPHET ER 25 MG PO CP24: 25.0000 mg | ORAL_CAPSULE | ORAL | Status: DC

## 2013-11-24 MED ORDER — FLUTICASONE-SALMETEROL 250-50 MCG/DOSE IN AEPB: 1.0000 | INHALATION_SPRAY | Freq: Two times a day (BID) | RESPIRATORY_TRACT | Status: DC

## 2013-11-24 NOTE — Progress Notes (Signed)
   Subjective:    Patient ID: Sherry Estrada, female    DOB: September 10, 1990, 23 y.o.   MRN: [id number]  HPI here for refill of medications Patient Active Problem List   Diagnosis Date Noted  . ADD (attention deficit disorder) 11/06/2011  . Allergic rhinitis 09/28/2011  . 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 Boyfriend of 8 years can tell when she's not taking meds No side effects  She has a history of asthma since early childhood//she uses inhaled steroids and when necessary albuterol// also a history of allergies which have recently been better--Evaluated by an allergist 3 years ago but never a pulmonologist Even when on consistent inhaled steroids with whole allergy control she cannot run for more than 3- 4 minutes without chest tightness and the need to quit exercising because of shortness of breath   Psychosocial stable   no other medical problems Off contraception due to recurrent yeast  Review of Systems  no fever chills or night sweats No weight loss No fatigue No Chest pain or palpitations No headaches No tremors No sleep disruption    Objective:   Physical Exam BP 112/78  Pulse 88  Temp(Src) 99.5 F (37.5 C) (Oral)  Resp 19  Ht 5' 6.5" (1.689 m)  Wt 135 lb 6.4 oz (61.417 kg)  BMI 21.53 kg/m2  SpO2 99%  LMP 11/16/2013 HEENT clear No wheezing with forced expiration Neurological intact   mood good affect appropriate thought content normal       Assessment & Plan:  ADD (attention deficit disorder) - Plan: amphetamine-dextroamphetamine (ADDERALL XR) 25 MG 24 hr capsule, amphetamine-dextroamphetamine (ADDERALL XR) 25 MG 24 hr capsule, amphetamine-dextroamphetamine (ADDERALL XR) 25 MG 24 hr capsule  Asthma, chronic, mild persistent, uncomplicated - Plan: Fluticasone-Salmeterol (ADVAIR DISKUS) 250-50 MCG/DOSE AEPB  Other seasonal allergic rhinitis  Asthma, mild persistent,  uncomplicated  Meds ordered this encounter  Medications  . amphetamine-dextroamphetamine (ADDERALL XR) 25 MG 24 hr capsule    Sig: Take 1 capsule by mouth every morning.    Dispense:  30 capsule    Refill:  0  . amphetamine-dextroamphetamine (ADDERALL XR) 25 MG 24 hr capsule    Sig: Take 1 capsule by mouth every morning. For 30d after signing    Dispense:  30 capsule    Refill:  0  . amphetamine-dextroamphetamine (ADDERALL XR) 25 MG 24 hr capsule    Sig: Take 1 capsule by mouth every morning. To fill 60d after signing    Dispense:  30 capsule    Refill:  0  . Fluticasone-Salmeterol (ADVAIR DISKUS) 250-50 MCG/DOSE AEPB    Sig: Inhale 1 puff into the lungs every 12 (twelve) hours.    Dispense:  60 each    Refill:  11  . montelukast (SINGULAIR) 10 MG tablet    Sig: Take 1 tablet (10 mg total) by mouth at bedtime.    Dispense:  90 tablet    Refill:  3   Add Singulair /if not able to exercise without restriction within 1 month she needs full pulmonary function study evaluation for why her asthma is uncontrolled with adequate therapy   Call add rf [id number] f/u 6 mo

## 2013-12-25 ENCOUNTER — Telehealth: Payer: Self-pay

## 2013-12-25 NOTE — Telephone Encounter (Signed)
PA needed for Adderall XR. Completed on phone and approved from 12/04/13 - 12/25/14, case # [id number]. Notified pt and pharm.

## 2013-12-27 ENCOUNTER — Other Ambulatory Visit: Payer: Self-pay | Admitting: *Deleted

## 2013-12-27 DIAGNOSIS — F988 Other specified behavioral and emotional disorders with onset usually occurring in childhood and adolescence: Secondary | ICD-10-CM

## 2013-12-27 NOTE — Telephone Encounter (Signed)
PHARMACY REQUEST  DEXTROAMPHETAMINE-AMPHET ER CAP. SR 24 HR

## 2013-12-27 NOTE — Telephone Encounter (Signed)
?  got 3 rx on 11/24/13

## 2013-12-30 NOTE — Telephone Encounter (Signed)
LEFT MESSAGE ABOUT PRESCRIPTION REFILL .    3 WERE GIVEN TO HER ON 10/25 NEED CLARIFICATION?

## 2013-12-30 NOTE — Telephone Encounter (Signed)
Did she get 3 rx 10/25?

## 2013-12-31 NOTE — Telephone Encounter (Signed)
LMOM to CB, but will decline the RF w/note to pharm that pt was given 3 last mos.

## 2014-01-23 ENCOUNTER — Telehealth: Payer: Self-pay

## 2014-01-23 DIAGNOSIS — F988 Other specified behavioral and emotional disorders with onset usually occurring in childhood and adolescence: Secondary | ICD-10-CM

## 2014-01-23 MED ORDER — AMPHETAMINE-DEXTROAMPHET ER 25 MG PO CP24: 25.0000 mg | ORAL_CAPSULE | ORAL | Status: DC

## 2014-01-23 NOTE — Telephone Encounter (Signed)
Forward to Jonne PlySarah- Chelle is out today.

## 2014-01-23 NOTE — Telephone Encounter (Signed)
Ready

## 2014-01-23 NOTE — Telephone Encounter (Signed)
Pt notified. Rx in pick up drawer. 

## 2014-01-23 NOTE — Telephone Encounter (Signed)
Patient misplaced her script for amphetamine-dextroamphetamine (ADDERALL XR) 25 MG 24 hr capsule   [phone]

## 2014-01-23 NOTE — Telephone Encounter (Signed)
Ok to sign 1 month supply

## 2014-02-25 ENCOUNTER — Telehealth: Payer: Self-pay

## 2014-02-25 DIAGNOSIS — F988 Other specified behavioral and emotional disorders with onset usually occurring in childhood and adolescence: Secondary | ICD-10-CM

## 2014-02-25 MED ORDER — AMPHETAMINE-DEXTROAMPHET ER 25 MG PO CP24
25.0000 mg | ORAL_CAPSULE | ORAL | Status: DC
Start: 2014-02-25 — End: 2014-02-26

## 2014-02-25 MED ORDER — AMPHETAMINE-DEXTROAMPHET ER 25 MG PO CP24: 25.0000 mg | ORAL_CAPSULE | ORAL | Status: DC

## 2014-02-25 NOTE — Telephone Encounter (Signed)
Refill Request amphetamine-dextroamphetamine (ADDERALL XR) 25 MG 24 hr capsule   [phone]

## 2014-02-25 NOTE — Telephone Encounter (Signed)
Arrange for her to pick up Meds ordered this encounter  Medications  . amphetamine-dextroamphetamine (ADDERALL XR) 25 MG 24 hr capsule    Sig: Take 1 capsule by mouth every morning.    Dispense:  30 capsule    Refill:  0  . amphetamine-dextroamphetamine (ADDERALL XR) 25 MG 24 hr capsule    Sig: Take 1 capsule by mouth every morning. For 30 days after signed    Dispense:  30 capsule    Refill:  0  . amphetamine-dextroamphetamine (ADDERALL XR) 25 MG 24 hr capsule    Sig: Take 1 capsule by mouth every morning. For 60 days after signed    Dispense:  30 capsule    Refill:  0

## 2014-02-25 NOTE — Telephone Encounter (Signed)
LMOM that this is ready for p/u 

## 2014-02-26 ENCOUNTER — Ambulatory Visit (INDEPENDENT_AMBULATORY_CARE_PROVIDER_SITE_OTHER): Payer: BC Managed Care – PPO | Admitting: Physician Assistant

## 2014-02-26 VITALS — BP 120/83 | HR 99 | Temp 98.5°F | Resp 16 | Ht 67.0 in | Wt 133.5 lb

## 2014-02-26 DIAGNOSIS — J309 Allergic rhinitis, unspecified: Secondary | ICD-10-CM

## 2014-02-26 DIAGNOSIS — R05 Cough: Secondary | ICD-10-CM

## 2014-02-26 DIAGNOSIS — R059 Cough, unspecified: Secondary | ICD-10-CM

## 2014-02-26 DIAGNOSIS — J4530 Mild persistent asthma, uncomplicated: Secondary | ICD-10-CM

## 2014-02-26 DIAGNOSIS — J019 Acute sinusitis, unspecified: Secondary | ICD-10-CM

## 2014-02-26 MED ORDER — AMOXICILLIN-POT CLAVULANATE 875-125 MG PO TABS: 1.0000 | ORAL_TABLET | Freq: Two times a day (BID) | ORAL | Status: AC

## 2014-02-26 MED ORDER — PREDNISONE 20 MG PO TABS: ORAL_TABLET | ORAL | Status: DC

## 2014-02-26 MED ORDER — HYDROCOD POLST-CHLORPHEN POLST 10-8 MG/5ML PO LQCR: 5.0000 mL | Freq: Two times a day (BID) | ORAL | Status: DC | PRN

## 2014-02-26 MED ORDER — FLUCONAZOLE 150 MG PO TABS: 150.0000 mg | ORAL_TABLET | Freq: Once | ORAL | Status: DC

## 2014-02-26 NOTE — Patient Instructions (Signed)
Get plenty of rest and drink at least 64 ounces of water daily. 

## 2014-02-26 NOTE — Progress Notes (Signed)
Subjective:    Patient ID: Sherry Estrada, female    DOB: 07-19-1990, 24 y.o.   MRN: [id number]   PCP: Alasha Mcguinness, PA-C  Chief Complaint  Patient presents with  . Cough  . Wheezing  . Shortness of Breath  . Ears feel stopped up    No Known Allergies  Patient Active Problem List   Diagnosis Date Noted  . ADD (attention deficit disorder) 11/06/2011  . Allergic rhinitis 09/28/2011  . Asthma 05/02/2011    Prior to Admission medications   Medication Sig Start Date End Date Taking? Authorizing Provider  albuterol (PROVENTIL HFA;VENTOLIN HFA) 108 (90 BASE) MCG/ACT inhaler Inhale 1 puff into the lungs every 6 (six) hours as needed for wheezing. 11/19/12  Yes Dontavis Tschantz S Kylee Umana, PA-C  amphetamine-dextroamphetamine (ADDERALL XR) 25 MG 24 hr capsule Take 1 capsule by mouth every morning. 02/25/14  Yes Tonye Pearson, MD  EPINEPHrine (EPIPEN JR) 0.15 MG/0.3ML injection Inject 0.15 mg into the muscle as needed.   Yes Historical Provider, MD  Fluticasone-Salmeterol (ADVAIR DISKUS) 250-50 MCG/DOSE AEPB Inhale 1 puff into the lungs every 12 (twelve) hours. 11/24/13  Yes Tonye Pearson, MD  montelukast (SINGULAIR) 10 MG tablet Take 1 tablet (10 mg total) by mouth at bedtime. 11/24/13  Yes Tonye Pearson, MD    Medical, Surgical, Family and Social History reviewed and updated.  HPI  Presents with her mother, reporting illness x 10 days.  Initially, she thought she just had a cold, but it's not improving. Coughing is getting worse. "My asthma is acting up." She's used extra doses of Advair for the past week. Albuterol use also increased, but with minimal improvement of her coughing. Lots of nasal congestion and ear fullness. Face feels full, but not painful. Cough makes her feel SOB and keeps her awake at night.  Cough is not productive. Initially felt achy all over. No N/V/diarrhea.  Sinus surgery last year.  Review of Systems As above.    Objective:   Physical Exam    Constitutional: She is oriented to person, place, and time. Vital signs are normal. She appears well-developed and well-nourished. She is active and cooperative. No distress.  BP 120/83 mmHg  Pulse 99  Temp(Src) 98.5 F (36.9 C) (Oral)  Resp 16  Ht  (1.702 m)  Wt 133 lb 8 oz (60.555 kg)  BMI 20.90 kg/m2  SpO2 100%  LMP 02/26/2014  HENT:  Head: Normocephalic and atraumatic.  Right Ear: Hearing, external ear and ear canal normal. Tympanic membrane is not injected, not perforated, not erythematous, not retracted and not bulging. A middle ear effusion (R>L) is present.  Left Ear: Hearing, external ear and ear canal normal. Tympanic membrane is not injected, not perforated, not erythematous, not retracted and not bulging. A middle ear effusion is present.  Nose: Mucosal edema present.  Mouth/Throat: Uvula is midline, oropharynx is clear and moist and mucous membranes are normal.  Eyes: Conjunctivae are normal. No scleral icterus.  Neck: Normal range of motion. Neck supple. No thyromegaly present.  Cardiovascular: Normal rate, regular rhythm and normal heart sounds.   Pulses:      Radial pulses are 2+ on the right side, and 2+ on the left side.  Pulmonary/Chest: Effort normal and breath sounds normal.  Lymphadenopathy:       Head (right side): No tonsillar, no preauricular, no posterior auricular and no occipital adenopathy present.       Head (left side): No tonsillar, no preauricular, no posterior auricular  and no occipital adenopathy present.    She has no cervical adenopathy.       Right: No supraclavicular adenopathy present.       Left: No supraclavicular adenopathy present.  Neurological: She is alert and oriented to person, place, and time. No sensory deficit.  Skin: Skin is warm, dry and intact. No rash noted. No cyanosis or erythema. Nails show no clubbing.  Psychiatric: She has a normal mood and affect. Her speech is normal and behavior is normal.          Assessment  & Plan:  1. Subacute sinusitis, unspecified location Likely secondary to the initial viral illness. Doubt influenza.  - amoxicillin-clavulanate (AUGMENTIN) 875-125 MG per tablet; Take 1 tablet by mouth 2 (two) times daily.  Dispense: 20 tablet; Refill: 0 - fluconazole (DIFLUCAN) 150 MG tablet; Take 1 tablet (150 mg total) by mouth once. Repeat if needed  Dispense: 2 tablet; Refill: 0 (she frequently has yeast vaginitis after antibiotic use). - predniSONE (DELTASONE) 20 MG tablet; Take 3 PO QAM x3days, 2 PO QAM x3days, 1 PO QAM x3days  Dispense: 18 tablet; Refill: 0  2. Cough Cough likely due to drainage from sinusitis and worse due to asthma, but no wheezing today. Continue prn albuterol. - chlorpheniramine-HYDROcodone (TUSSIONEX PENNKINETIC ER) 10-8 MG/5ML LQCR; Take 5 mLs by mouth every 12 (twelve) hours as needed for cough (cough).  Dispense: 100 mL; Refill: 0  3. Asthma, chronic, mild persistent, uncomplicated Continue Advair, but advised against doubling up on the dose.  If she has more frequent flares, consider increasing dose from 250/50 to 500/50 BID.  4. Allergic rhinitis, unspecified allergic rhinitis type Continue Singulair.   Fernande Brashelle S. Jaydian Santana, PA-C Physician Assistant-Certified [facility]

## 2014-06-01 ENCOUNTER — Ambulatory Visit (INDEPENDENT_AMBULATORY_CARE_PROVIDER_SITE_OTHER): Payer: BC Managed Care – PPO | Admitting: Family Medicine

## 2014-06-01 VITALS — BP 106/68 | HR 88 | Temp 98.0°F | Resp 16 | Ht 67.5 in | Wt 131.0 lb

## 2014-06-01 DIAGNOSIS — F988 Other specified behavioral and emotional disorders with onset usually occurring in childhood and adolescence: Secondary | ICD-10-CM

## 2014-06-01 DIAGNOSIS — F909 Attention-deficit hyperactivity disorder, unspecified type: Secondary | ICD-10-CM

## 2014-06-01 MED ORDER — AMPHETAMINE-DEXTROAMPHET ER 25 MG PO CP24: 25.0000 mg | ORAL_CAPSULE | ORAL | Status: DC

## 2014-06-01 NOTE — Patient Instructions (Signed)
3 months of prescriptions provided today. Follow up with Chelle in next 3 months. At her discretion - she may be able to prescribe your next 3 months without an office visit, or may want to see you in follow up first. Enjoy the summer break.    [facility] Policy for Prescribing Controlled Substances (Revised 12/2011) 1. Prescriptions for controlled substances will be filled by ONE provider at [facility] with whom you have established and developed a plan for your care, including follow-up. 2. You are encouraged to schedule an appointment with your prescriber at our appointment center for follow-up visits whenever possible. 3. If you request a prescription for the controlled substance while at [facility] for an acute problem (with someone other than your regular prescriber), you MAY be given a ONE-TIME prescription for a 30-day supply of the controlled substance, to allow time for you to return to see your regular prescriber for additional prescriptions.

## 2014-06-01 NOTE — Progress Notes (Signed)
Subjective:    Patient ID: Sherry Estrada, female    DOB: 08-Oct-1990, 24 y.o.   MRN: [id number]  HPI Sherry Estrada is a 24 y.o. female  PCP: JEFFERY,CHELLE, PA-C   ADD- Taking Adderall XR  QD.  Same dose for awhile. Sometimes not taking on weekends when needing to relax. Does note hard time sitting still if taking it, but helps with focus during work.  No palpitations/chest pain.  No insomnia. med wears off around 3pm. Denies wt loss or appetite issues. No new side effects.   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      Patient Active Problem List   Diagnosis Date Noted  . ADD (attention deficit disorder) 11/06/2011  . Allergic rhinitis 09/28/2011  . Asthma 05/02/2011   Past Medical History  Diagnosis Date  . ADD (attention deficit disorder)   . Allergy   . Asthma   . PONV (postoperative nausea and vomiting)   . Chronic sinusitis    Past Surgical History  Procedure Laterality Date  . Appendectomy    . Wisdom tooth extraction    . Turbinate reduction Bilateral 10/29/2012    Procedure: BILATERAL ENDOSCOPIC TOTAL ETHMOIDECTOMY/ BILATERAL MAXILLARY ANTROSTOMY ;  Surgeon: Darletta Moll, MD;  Location: [facility];  Service: ENT;  Laterality: Bilateral;  . Nasal sinus surgery Bilateral 10/29/2012    Procedure: BILATERAL ENDOSCOPIC SINUS SURGERY/BILATERAL PARTIAL INFERIOR TURBINATE RESECTION;  Surgeon: Darletta Moll, MD;  Location: [facility];  Service: ENT;  Laterality: Bilateral;   No Known Allergies Prior to Admission medications   Medication Sig Start Date End Date Taking? Authorizing Provider  albuterol (PROVENTIL HFA;VENTOLIN HFA) 108 (90 BASE) MCG/ACT inhaler Inhale 1 puff into the lungs every 6 (six) hours as needed for wheezing. 11/19/12  Yes Chelle S Jeffery, PA-C  amphetamine-dextroamphetamine (ADDERALL XR) 25 MG 24 hr capsule Take 1 capsule by mouth every morning. 02/25/14  Yes Tonye Pearson, MD    EPINEPHrine (EPIPEN JR) 0.15 MG/0.3ML injection Inject 0.15 mg into the muscle as needed.   Yes Historical Provider, MD  fluconazole (DIFLUCAN) 150 MG tablet Take 1 tablet (150 mg total) by mouth once. Repeat if needed 02/26/14  Yes Chelle S Jeffery, PA-C  Fluticasone-Salmeterol (ADVAIR DISKUS) 250-50 MCG/DOSE AEPB Inhale 1 puff into the lungs every 12 (twelve) hours. 11/24/13  Yes Tonye Pearson, MD  predniSONE (DELTASONE) 20 MG tablet Take 3 PO QAM x3days, 2 PO QAM x3days, 1 PO QAM x3days 02/26/14  Yes Chelle S Jeffery, PA-C  chlorpheniramine-HYDROcodone (TUSSIONEX PENNKINETIC ER) 10-8 MG/5ML LQCR Take 5 mLs by mouth every 12 (twelve) hours as needed for cough (cough). Patient not taking: Reported on 06/01/2014 02/26/14   Chelle S Jeffery, PA-C  montelukast (SINGULAIR) 10 MG tablet Take 1 tablet (10 mg total) by mouth at bedtime. Patient not taking: Reported on 06/01/2014 11/24/13   Tonye Pearson, MD   History   Social History  . Marital Status: Single    Spouse Name: n/a  . Number of Children: 0  . Years of Education: N/A   Occupational History  . 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 Smoking status: Never Smoker   . Smokeless tobacco: Never Used  . Alcohol Use: 0.0 - 1.0 oz/week    0-2 Standard drinks or equivalent per week     Comment: once every 3 months  . Drug Use: No  .  Sexual Activity: Yes    Birth Control/ Protection: Pill   Other Topics Concern  . Not on file   Social History Narrative   Lives with a roommate.  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       Review of Systems  Constitutional: Negative for unexpected weight change.  Respiratory: Negative for chest tightness.   Cardiovascular: Negative for chest pain and palpitations.  Psychiatric/Behavioral: Negative for decreased concentration and agitation. The patient is not nervous/anxious and is not hyperactive.        Objective:   Physical Exam  Constitutional: She is oriented to person, place,  and time. She appears well-developed and well-nourished.  HENT:  Head: Normocephalic and atraumatic.  Eyes: Conjunctivae and EOM are normal. Pupils are equal, round, and reactive to light.  Neck: Carotid bruit is not present.  Cardiovascular: Normal rate, regular rhythm, normal heart sounds and intact distal pulses.   Pulmonary/Chest: Effort normal and breath sounds normal.  Abdominal: She exhibits no pulsatile midline mass.  Neurological: She is alert and oriented to person, place, and time.  Skin: Skin is warm and dry.  Psychiatric: She has a normal mood and affect. Her behavior is normal. Judgment and thought content normal.  Vitals reviewed.  Filed Vitals:   06/01/14 0906  BP: 106/68  Pulse: 88  Temp: 98 F (36.7 C)  TempSrc: Oral  Resp: 16  Height: 5' 7.5" (1.715 m)  Weight: 131 lb (59.421 kg)  SpO2: 98%         Assessment & Plan:   Sherry Estrada is a 24 y.o. female ADD (attention deficit disorder) - Plan: amphetamine-dextroamphetamine (ADDERALL XR) 25 MG 24 hr capsule, DISCONTINUED: amphetamine-dextroamphetamine (ADDERALL XR) 25 MG 24 hr capsule, DISCONTINUED: amphetamine-dextroamphetamine (ADDERALL XR) 25 MG 24 hr capsule  - controlled, and on current dose for some time without side effects.   - refilled same dose for 3 months - can call Chelle for next 3 months, or may need OV as she is PCP.   Meds ordered this encounter  Medications  . DISCONTD: amphetamine-dextroamphetamine (ADDERALL XR) 25 MG 24 hr capsule    Sig: Take 1 capsule by mouth every morning.    Dispense:  30 capsule    Refill:  0  . DISCONTD: amphetamine-dextroamphetamine (ADDERALL XR) 25 MG 24 hr capsule    Sig: Take 1 capsule by mouth every morning.    Dispense:  30 capsule    Refill:  0    May fill 30 days after date on prescription.  Marland Kitchen amphetamine-dextroamphetamine (ADDERALL XR) 25 MG 24 hr capsule    Sig: Take 1 capsule by mouth every morning.    Dispense:  30 capsule    Refill:  0    May  fill 60 days after date on prescription.   Patient Instructions  3 months of prescriptions provided today. Follow up with Chelle in next 3 months. At her discretion - she may be able to prescribe your next 3 months without an office visit, or may want to see you in follow up first. Enjoy the summer break.    [facility] Policy for Prescribing Controlled Substances (Revised 12/2011) 1. Prescriptions for controlled substances will be filled by ONE provider at [facility] with whom you have established and developed a plan for your care, including follow-up. 2. You are encouraged to schedule an appointment with your prescriber at our appointment center for follow-up visits whenever possible. 3. If you request a prescription for the controlled substance while at [facility] for an  acute problem (with someone other than your regular prescriber), you MAY be given a ONE-TIME prescription for a 30-day supply of the controlled substance, to allow time for you to return to see your regular prescriber for additional prescriptions.

## 2014-06-14 ENCOUNTER — Other Ambulatory Visit: Payer: Self-pay | Admitting: Physician Assistant

## 2014-08-27 ENCOUNTER — Telehealth: Payer: Self-pay

## 2014-08-27 DIAGNOSIS — F988 Other specified behavioral and emotional disorders with onset usually occurring in childhood and adolescence: Secondary | ICD-10-CM

## 2014-08-27 MED ORDER — AMPHETAMINE-DEXTROAMPHET ER 25 MG PO CP24: 25.0000 mg | ORAL_CAPSULE | ORAL | Status: DC

## 2014-08-27 NOTE — Telephone Encounter (Signed)
rx printed.  Meds ordered this encounter  Medications  . amphetamine-dextroamphetamine (ADDERALL XR) 25 MG 24 hr capsule    Sig: Take 1 capsule by mouth every morning.    Dispense:  30 capsule    Refill:  0    Order Specific Question:  Supervising Provider    Answer:  DOOLITTLE, ROBERT P [[id number]]

## 2014-08-27 NOTE — Telephone Encounter (Signed)
Pt is needing refill on adderall

## 2014-08-27 NOTE — Telephone Encounter (Signed)
Pt.notified

## 2014-09-26 ENCOUNTER — Telehealth: Payer: Self-pay

## 2014-09-26 DIAGNOSIS — F988 Other specified behavioral and emotional disorders with onset usually occurring in childhood and adolescence: Secondary | ICD-10-CM

## 2014-09-26 NOTE — Telephone Encounter (Signed)
Pt is needing a refill on adderall °

## 2014-09-29 MED ORDER — AMPHETAMINE-DEXTROAMPHET ER 25 MG PO CP24: 25.0000 mg | ORAL_CAPSULE | ORAL | Status: DC

## 2014-09-29 NOTE — Telephone Encounter (Signed)
Rxs printed.  Please let the patient know that I'll need to see her for the next prescriptions.  Meds ordered this encounter  Medications  . amphetamine-dextroamphetamine (ADDERALL XR) 25 MG 24 hr capsule    Sig: Take 1 capsule by mouth every morning.    Dispense:  30 capsule    Refill:  0    Order Specific Question:  Supervising Provider    Answer:  DOOLITTLE, ROBERT P [[id number]]  . amphetamine-dextroamphetamine (ADDERALL XR) 25 MG 24 hr capsule    Sig: Take 1 capsule by mouth every morning.    Dispense:  30 capsule    Refill:  0    May fill 30 days after date on this prescription    Order Specific Question:  Supervising Provider    Answer:  DOOLITTLE, ROBERT P [[id number]]  . amphetamine-dextroamphetamine (ADDERALL XR) 25 MG 24 hr capsule    Sig: Take 1 capsule by mouth every morning.    Dispense:  30 capsule    Refill:  0    May fill 60 days after date on this prescription    Order Specific Question:  Supervising Provider    Answer:  Ellamae Sia P [[id number]]

## 2014-09-29 NOTE — Telephone Encounter (Signed)
Left message Rx's ready up

## 2014-11-29 ENCOUNTER — Other Ambulatory Visit: Payer: Self-pay | Admitting: Internal Medicine

## 2014-12-24 ENCOUNTER — Ambulatory Visit (INDEPENDENT_AMBULATORY_CARE_PROVIDER_SITE_OTHER): Payer: BC Managed Care – PPO | Admitting: Physician Assistant

## 2014-12-24 VITALS — BP 120/78 | HR 97 | Temp 98.5°F | Resp 16 | Ht 66.75 in | Wt 128.0 lb

## 2014-12-24 DIAGNOSIS — F909 Attention-deficit hyperactivity disorder, unspecified type: Secondary | ICD-10-CM | POA: Diagnosis not present

## 2014-12-24 DIAGNOSIS — J4530 Mild persistent asthma, uncomplicated: Secondary | ICD-10-CM

## 2014-12-24 DIAGNOSIS — Z23 Encounter for immunization: Secondary | ICD-10-CM

## 2014-12-24 DIAGNOSIS — F988 Other specified behavioral and emotional disorders with onset usually occurring in childhood and adolescence: Secondary | ICD-10-CM

## 2014-12-24 MED ORDER — AMPHETAMINE-DEXTROAMPHET ER 25 MG PO CP24: 25.0000 mg | ORAL_CAPSULE | ORAL | Status: DC

## 2014-12-24 MED ORDER — FLUTICASONE-SALMETEROL 250-50 MCG/DOSE IN AEPB: INHALATION_SPRAY | RESPIRATORY_TRACT | Status: DC

## 2014-12-24 NOTE — Patient Instructions (Signed)
Tdap Vaccine (Tetanus, Diphtheria and Pertussis): What You Need to Know 1. Why get vaccinated? Tetanus, diphtheria and pertussis are very serious diseases. Tdap vaccine can protect us from these diseases. And, Tdap vaccine given to pregnant women can protect newborn babies against pertussis. TETANUS (Lockjaw) is rare in the [place] today. It causes painful muscle tightening and stiffness, usually all over the body.  It can lead to tightening of muscles in the head and neck so you can't open your mouth, swallow, or sometimes even breathe. Tetanus kills about 1 out of 10 people who are infected even after receiving the best medical care. DIPHTHERIA is also rare in the [place] today. It can cause a thick coating to form in the back of the throat.  It can lead to breathing problems, heart failure, paralysis, and death. PERTUSSIS (Whooping Cough) causes severe coughing spells, which can cause difficulty breathing, vomiting and disturbed sleep.  It can also lead to weight loss, incontinence, and rib fractures. Up to 2 in 100 adolescents and 5 in 100 adults with pertussis are hospitalized or have complications, which could include pneumonia or death. These diseases are caused by bacteria. Diphtheria and pertussis are spread from person to person through secretions from coughing or sneezing. Tetanus enters the body through cuts, scratches, or wounds. Before vaccines, as many as 200,000 cases of diphtheria, 200,000 cases of pertussis, and hundreds of cases of tetanus, were reported in the [place] each year. Since vaccination began, reports of cases for tetanus and diphtheria have dropped by about 99% and for pertussis by about 80%. 2. Tdap vaccine Tdap vaccine can protect adolescents and adults from tetanus, diphtheria, and pertussis. One dose of Tdap is routinely given at age 11 or 12. People who did not get Tdap at that age should get it as soon as possible. Tdap is especially important  for healthcare professionals and anyone having close contact with a baby younger than 12 months. Pregnant women should get a dose of Tdap during every pregnancy, to protect the newborn from pertussis. Infants are most at risk for severe, life-threatening complications from pertussis. Another vaccine, called Td, protects against tetanus and diphtheria, but not pertussis. A Td booster should be given every 10 years. Tdap may be given as one of these boosters if you have never gotten Tdap before. Tdap may also be given after a severe cut or burn to prevent tetanus infection. Your doctor or the person giving you the vaccine can give you more information. Tdap may safely be given at the same time as other vaccines. 3. Some people should not get this vaccine  A person who has ever had a life-threatening allergic reaction after a previous dose of any diphtheria, tetanus or pertussis containing vaccine, OR has a severe allergy to any part of this vaccine, should not get Tdap vaccine. Tell the person giving the vaccine about any severe allergies.  Anyone who had coma or long repeated seizures within 7 days after a childhood dose of DTP or DTaP, or a previous dose of Tdap, should not get Tdap, unless a cause other than the vaccine was found. They can still get Td.  Talk to your doctor if you:  have seizures or another nervous system problem,  had severe pain or swelling after any vaccine containing diphtheria, tetanus or pertussis,  ever had a condition called Guillain-Barr Syndrome (GBS),  aren't feeling well on the day the shot is scheduled. 4. Risks With any medicine, including vaccines, there is   a chance of side effects. These are usually mild and go away on their own. Serious reactions are also possible but are rare. Most people who get Tdap vaccine do not have any problems with it. Mild problems following Tdap (Did not interfere with activities)  Pain where the shot was given (about 3 in 4  adolescents or 2 in 3 adults)  Redness or swelling where the shot was given (about 1 person in 5)  Mild fever of at least 100.4F (up to about 1 in 25 adolescents or 1 in 100 adults)  Headache (about 3 or 4 people in 10)  Tiredness (about 1 person in 3 or 4)  Nausea, vomiting, diarrhea, stomach ache (up to 1 in 4 adolescents or 1 in 10 adults)  Chills, sore joints (about 1 person in 10)  Body aches (about 1 person in 3 or 4)  Rash, swollen glands (uncommon) Moderate problems following Tdap (Interfered with activities, but did not require medical attention)  Pain where the shot was given (up to 1 in 5 or 6)  Redness or swelling where the shot was given (up to about 1 in 16 adolescents or 1 in 12 adults)  Fever over 102F (about 1 in 100 adolescents or 1 in 250 adults)  Headache (about 1 in 7 adolescents or 1 in 10 adults)  Nausea, vomiting, diarrhea, stomach ache (up to 1 or 3 people in 100)  Swelling of the entire arm where the shot was given (up to about 1 in 500). Severe problems following Tdap (Unable to perform usual activities; required medical attention)  Swelling, severe pain, bleeding and redness in the arm where the shot was given (rare). Problems that could happen after any vaccine:  People sometimes faint after a medical procedure, including vaccination. Sitting or lying down for about 15 minutes can help prevent fainting, and injuries caused by a fall. Tell your doctor if you feel dizzy, or have vision changes or ringing in the ears.  Some people get severe pain in the shoulder and have difficulty moving the arm where a shot was given. This happens very rarely.  Any medication can cause a severe allergic reaction. Such reactions from a vaccine are very rare, estimated at fewer than 1 in a million doses, and would happen within a few minutes to a few hours after the vaccination. As with any medicine, there is a very remote chance of a vaccine causing a serious  injury or death. The safety of vaccines is always being monitored. For more information, visit: www.cdc.gov/vaccinesafety/ 5. What if there is a serious problem? What should I look for?  Look for anything that concerns you, such as signs of a severe allergic reaction, very high fever, or unusual behavior.  Signs of a severe allergic reaction can include hives, swelling of the face and throat, difficulty breathing, a fast heartbeat, dizziness, and weakness. These would usually start a few minutes to a few hours after the vaccination. What should I do?  If you think it is a severe allergic reaction or other emergency that can't wait, call 9-1-1 or get the person to the nearest hospital. Otherwise, call your doctor.  Afterward, the reaction should be reported to the Vaccine Adverse Event Reporting System (VAERS). Your doctor might file this report, or you can do it yourself through the VAERS web site at www.vaers.hhs.gov, or by calling 1-800-822-7967. VAERS does not give medical advice.  6. The National Vaccine Injury Compensation Program The National Vaccine Injury Compensation Program (  VICP) is a federal program that was created to compensate people who may have been injured by certain vaccines. Persons who believe they may have been injured by a vaccine can learn about the program and about filing a claim by calling 1-800-338-2382 or visiting the VICP website at www.hrsa.gov/vaccinecompensation. There is a time limit to file a claim for compensation. 7. How can I learn more?  Ask your doctor. He or she can give you the vaccine package insert or suggest other sources of information.  Call your local or state health department.  Contact the Centers for Disease Control and Prevention (CDC):  Call 1-800-232-4636 (1-800-CDC-INFO) or  Visit CDC's website at www.cdc.gov/vaccines CDC Tdap Vaccine VIS (03/26/13)   This information is not intended to replace advice given to you by your health care  provider. Make sure you discuss any questions you have with your health care provider.   Document Released: 07/19/2011 Document Revised: 02/07/2014 Document Reviewed: 05/01/2013 Elsevier Interactive Patient Education 2016 [employer]  

## 2014-12-24 NOTE — Progress Notes (Signed)
Patient ID: Sherry Estrada, female    DOB: November 19, 1990, 24 y.o.   MRN: [id number]  PCP: Olene Floss  Subjective:   Chief Complaint  Patient presents with  . Medication Refill    Adderall and Advair    HPI Presents for evaluation of ADD and asthma, and medication refills.  Both are well controlled and she tolerates the medications without difficulty. No sleep disturbances. No appetite reduction. No abdominal pain. No SOB. No wheezing.  Rescue inhaler only when she gets a cold or when her allergies flare with season changes.    Review of Systems     Patient Active Problem List   Diagnosis Date Noted  . ADD (attention deficit disorder) 11/06/2011  . Allergic rhinitis 09/28/2011  . Asthma 05/02/2011     Prior to Admission medications   Medication Sig Start Date End Date Taking? Authorizing Provider  amphetamine-dextroamphetamine (ADDERALL XR) 25 MG 24 hr capsule Take 1 capsule by mouth every morning. 09/29/14  Yes Chellie Vanlue, PA-C  amphetamine-dextroamphetamine (ADDERALL XR) 25 MG 24 hr capsule Take 1 capsule by mouth every morning. 09/29/14  Yes Emaleigh Guimond, PA-C  amphetamine-dextroamphetamine (ADDERALL XR) 25 MG 24 hr capsule Take 1 capsule by mouth every morning. 09/29/14  Yes Isadore Bokhari, PA-C  ampicillin (PRINCIPEN) 500 MG capsule Take 500 mg by mouth 4 (four) times daily.   Yes Historical Provider, MD  EPINEPHrine (EPIPEN JR) 0.15 MG/0.3ML injection Inject 0.15 mg into the muscle as needed.   Yes Historical Provider, MD  Fluticasone-Salmeterol (ADVAIR DISKUS) 250-50 MCG/DOSE AEPB INHALE 1 PUFF EVERY 12 HOURS. 11/30/14  Yes Tonye Pearson, MD  montelukast (SINGULAIR) 10 MG tablet Take 1 tablet (10 mg total) by mouth at bedtime. 11/24/13  Yes Tonye Pearson, MD  PROVENTIL HFA 108 (90 BASE) MCG/ACT inhaler INHALE 1 PUFF INTO THE LUNGS EVERY 6 HOURS AS NEEDED FOR WHEEZING 06/14/14  Yes Blinda Turek, PA-C     No Known Allergies     Objective:    Physical Exam  Constitutional: She is oriented to person, place, and time. Vital signs are normal. She appears well-developed and well-nourished. She is active and cooperative. No distress.  BP 120/78 mmHg  Pulse 97  Temp(Src) 98.5 F (36.9 C) (Oral)  Resp 16  Ht 5' 6.75" (1.695 m)  Wt 128 lb (58.06 kg)  BMI 20.21 kg/m2  SpO2 98%  LMP 12/23/2014  HENT:  Head: Normocephalic and atraumatic.  Right Ear: Hearing normal.  Left Ear: Hearing normal.  Eyes: Conjunctivae are normal. No scleral icterus.  Neck: Normal range of motion. Neck supple. No thyromegaly present.  Cardiovascular: Normal rate, regular rhythm and normal heart sounds.   Pulses:      Radial pulses are 2+ on the right side, and 2+ on the left side.  Pulmonary/Chest: Effort normal and breath sounds normal.  Lymphadenopathy:       Head (right side): No tonsillar, no preauricular, no posterior auricular and no occipital adenopathy present.       Head (left side): No tonsillar, no preauricular, no posterior auricular and no occipital adenopathy present.    She has no cervical adenopathy.       Right: No supraclavicular adenopathy present.       Left: No supraclavicular adenopathy present.  Neurological: She is alert and oriented to person, place, and time. No sensory deficit.  Skin: Skin is warm, dry and intact. No rash noted. No cyanosis or erythema. Nails show no clubbing.  Psychiatric: She has  a normal mood and affect. Her speech is normal and behavior is normal.           Assessment & Plan:   1. ADD (attention deficit disorder) Stable. Controlled. May call for prescriptions in 3 months. Follow-up in 6 months. - amphetamine-dextroamphetamine (ADDERALL XR) 25 MG 24 hr capsule; Take 1 capsule by mouth every morning.  Dispense: 30 capsule; Refill: 0 - amphetamine-dextroamphetamine (ADDERALL XR) 25 MG 24 hr capsule; Take 1 capsule by mouth every morning.  Dispense: 30 capsule; Refill: 0 -  amphetamine-dextroamphetamine (ADDERALL XR) 25 MG 24 hr capsule; Take 1 capsule by mouth every morning.  Dispense: 30 capsule; Refill: 0  2. Asthma, chronic, mild persistent, uncomplicated Controlled. Continue current regimen. - Fluticasone-Salmeterol (ADVAIR DISKUS) 250-50 MCG/DOSE AEPB; INHALE 1 PUFF EVERY 12 HOURS.  Dispense: 60 each; Refill: 0  3. Need for Tdap vaccination - Tdap vaccine greater than or equal to 7yo IM   Fernande Brashelle S. Xitlalic Maslin, PA-C Physician Assistant-Certified [facility]

## 2014-12-31 ENCOUNTER — Other Ambulatory Visit: Payer: Self-pay

## 2014-12-31 NOTE — Telephone Encounter (Signed)
PA approved for generic Adderall ER 25 mg on covermymeds through 12/31/15, case # [id number]. Notified pharm.

## 2015-01-27 ENCOUNTER — Other Ambulatory Visit: Payer: Self-pay | Admitting: Physician Assistant

## 2015-01-27 ENCOUNTER — Other Ambulatory Visit: Payer: Self-pay | Admitting: Internal Medicine

## 2015-03-23 ENCOUNTER — Other Ambulatory Visit: Payer: Self-pay | Admitting: Physician Assistant

## 2015-03-28 ENCOUNTER — Telehealth: Payer: Self-pay

## 2015-03-28 DIAGNOSIS — F988 Other specified behavioral and emotional disorders with onset usually occurring in childhood and adolescence: Secondary | ICD-10-CM

## 2015-03-28 MED ORDER — AMPHETAMINE-DEXTROAMPHET ER 25 MG PO CP24: 25.0000 mg | ORAL_CAPSULE | ORAL | Status: DC

## 2015-03-28 NOTE — Telephone Encounter (Signed)
Meds ordered this encounter  Medications  . amphetamine-dextroamphetamine (ADDERALL XR) 25 MG 24 hr capsule    Sig: Take 1 capsule by mouth every morning.    Dispense:  30 capsule    Refill:  0    May fill 60 days after date on this prescription    Order Specific Question:  Supervising Provider    Answer:  DOOLITTLE, ROBERT P [[id number]]  . amphetamine-dextroamphetamine (ADDERALL XR) 25 MG 24 hr capsule    Sig: Take 1 capsule by mouth every morning.    Dispense:  30 capsule    Refill:  0    May fill 30 days after date on this prescription    Order Specific Question:  Supervising Provider    Answer:  DOOLITTLE, ROBERT P [[id number]]  . amphetamine-dextroamphetamine (ADDERALL XR) 25 MG 24 hr capsule    Sig: Take 1 capsule by mouth every morning.    Dispense:  30 capsule    Refill:  0    Order Specific Question:  Supervising Provider    Answer:  DOOLITTLE, ROBERT P [[id number]]

## 2015-03-28 NOTE — Telephone Encounter (Signed)
PATIENT WOULD LIKE CHELLE TO KNOW THAT IT IS TIME TO GET HER REFILL ON ADDERALL 25 MG ER. PLEASE CALL HER WHEN IT CAN BE PICKED UP. BEST PHONE [phone] (CELL)  MBC

## 2015-03-30 NOTE — Telephone Encounter (Signed)
Notified pt ready on VM. 

## 2015-04-26 ENCOUNTER — Other Ambulatory Visit: Payer: Self-pay | Admitting: Physician Assistant

## 2015-06-26 ENCOUNTER — Ambulatory Visit (INDEPENDENT_AMBULATORY_CARE_PROVIDER_SITE_OTHER): Payer: BC Managed Care – PPO | Admitting: Physician Assistant

## 2015-06-26 VITALS — BP 112/68 | HR 94 | Resp 16 | Ht 66.75 in | Wt 133.4 lb

## 2015-06-26 DIAGNOSIS — F909 Attention-deficit hyperactivity disorder, unspecified type: Secondary | ICD-10-CM | POA: Diagnosis not present

## 2015-06-26 DIAGNOSIS — F988 Other specified behavioral and emotional disorders with onset usually occurring in childhood and adolescence: Secondary | ICD-10-CM

## 2015-06-26 DIAGNOSIS — J4530 Mild persistent asthma, uncomplicated: Secondary | ICD-10-CM

## 2015-06-26 DIAGNOSIS — J302 Other seasonal allergic rhinitis: Secondary | ICD-10-CM | POA: Diagnosis not present

## 2015-06-26 DIAGNOSIS — K219 Gastro-esophageal reflux disease without esophagitis: Secondary | ICD-10-CM

## 2015-06-26 MED ORDER — AMPHETAMINE-DEXTROAMPHET ER 25 MG PO CP24: 25.0000 mg | ORAL_CAPSULE | ORAL | Status: DC

## 2015-06-26 MED ORDER — FLUTICASONE-SALMETEROL 250-50 MCG/DOSE IN AEPB: INHALATION_SPRAY | RESPIRATORY_TRACT | Status: DC

## 2015-06-26 MED ORDER — MONTELUKAST SODIUM 10 MG PO TABS: ORAL_TABLET | ORAL | Status: DC

## 2015-06-26 NOTE — Patient Instructions (Addendum)
     IF you received an x-ray today, you will receive an invoice from [facility]. Please contact [facility] at [phone] with questions or concerns regarding your invoice.   IF you received labwork today, you will receive an invoice from [facility]. Please contact [facility] at [phone] with questions or concerns regarding your invoice.   Our billing staff will not be able to assist you with questions regarding bills from these companies.  You will be contacted with the lab results as soon as they are available. The fastest way to get your results is to activate your My Chart account. Instructions are located on the last page of this paperwork. If you have not heard from us regarding the results in 2 weeks, please contact this office.    Increase the Zantac to twice each day.  Things that often make reflux symptoms worse: Caffeine Carbonation (soda) Spicy foods Acidic foods (like tomato sauce, orange juice, lemonade) Fatty foods (including whole milk and ice cream) Stress (feeling sad, worried, nervous) Nicotine Alcohol NSAIDS (non-steroidal anti-inflammatories, like ibuprofen (Advil, Motrin) or naproxen (Aleve)).

## 2015-06-26 NOTE — Progress Notes (Signed)
Patient ID: Sherry Estrada, female    DOB: [birth year], 25 y.o.   MRN: [id number]  PCP: Olene Floss  Subjective:   Chief Complaint  Patient presents with  . Medication Refill    adderall, advair, singulair  . Gastroesophageal Reflux    x1 week; states it feels as though her food is moving down her throat slowly; feels as though food is stuck    HPI Presents for evaluation of reflux symptoms and for medication refills.  Bad allergy this season.  For the past week, notes that it feels like food is moving too slowly down her throat, and then feels a fullness in the throat, chest and epigastrum. Feels like something is stuck. Burning. Symptoms last about 2 hours after any meal, or drinks anything. Felt like her hot cup of coffee helped this morning, which was the first morning she woke up with symptoms. No nocturnal awakening with symptoms.  Zantac 150 mg PRN, usually once daily. It helps temporarily. Long history of stomach ache. Her first word was Rolaids. Infrequent defecation. Hisotry of heartburn. Brother required esophageal dilatation x 9. Grandmother had esophageal dysmotility. Aunt has stricture.     Review of Systems As above.    Patient Active Problem List   Diagnosis Date Noted  . ADD (attention deficit disorder) 11/06/2011  . Allergic rhinitis 09/28/2011  . Asthma 05/02/2011     Prior to Admission medications   Medication Sig Start Date End Date Taking? Authorizing Provider  ADVAIR DISKUS 250-50 MCG/DOSE AEPB INHALE 1 PUFF BY MOUTH EVERY 12 HOURS 04/28/15  Yes Fergie Sherbert, PA-C  amphetamine-dextroamphetamine (ADDERALL XR) 25 MG 24 hr capsule Take 1 capsule by mouth every morning. 03/28/15  Yes Isaac Lacson, PA-C  amphetamine-dextroamphetamine (ADDERALL XR) 25 MG 24 hr capsule Take 1 capsule by mouth every morning. 03/28/15  Yes Shirleymae Hauth, PA-C  amphetamine-dextroamphetamine (ADDERALL XR) 25 MG 24 hr capsule Take 1 capsule by mouth every  morning. 03/28/15  Yes Lilliona Blakeney, PA-C  ampicillin (PRINCIPEN) 500 MG capsule Take 500 mg by mouth 4 (four) times daily.   Yes Historical Provider, MD  EPINEPHrine (EPIPEN JR) 0.15 MG/0.3ML injection Inject 0.15 mg into the muscle as needed.   Yes Historical Provider, MD  montelukast (SINGULAIR) 10 MG tablet TAKE 1 TABLET BY MOUTH EVERY NIGHT AT BEDTIME 01/27/15  Yes Tonye Pearson, MD  PROVENTIL HFA 108 (90 BASE) MCG/ACT inhaler INHALE 1 PUFF INTO THE LUNGS EVERY 6 HOURS AS NEEDED FOR WHEEZING 06/14/14  Yes Javaya Oregon, PA-C  tretinoin (RETIN-A) 0.025 % cream APPLY AA OF FACE QPM 05/23/15   Historical Provider, MD     No Known Allergies     Objective:  Physical Exam  Constitutional: She is oriented to person, place, and time. She appears well-developed and well-nourished. She is active and cooperative. No distress.  BP 112/68 mmHg  Pulse 94  Resp 16  Ht 5' 6.75" (1.695 m)  Wt 133 lb 6.4 oz (60.51 kg)  BMI 21.06 kg/m2  SpO2 99%  LMP 06/12/2015  HENT:  Head: Normocephalic and atraumatic.  Right Ear: Hearing, tympanic membrane, external ear and ear canal normal.  Left Ear: Hearing, tympanic membrane, external ear and ear canal normal.  Nose: Nose normal.  Mouth/Throat: Uvula is midline, oropharynx is clear and moist and mucous membranes are normal. No oral lesions. Normal dentition. No uvula swelling.  Eyes: Conjunctivae are normal. No scleral icterus.  Neck: Normal range of motion. Neck supple. No thyromegaly present.  Cardiovascular:  Normal rate, regular rhythm and normal heart sounds.   Pulses:      Radial pulses are 2+ on the right side, and 2+ on the left side.  Pulmonary/Chest: Effort normal and breath sounds normal.  Abdominal: Normal appearance and bowel sounds are normal. There is no hepatosplenomegaly. There is tenderness in the epigastric area.  Lymphadenopathy:       Head (right side): No tonsillar, no preauricular, no posterior auricular and no occipital  adenopathy present.       Head (left side): No tonsillar, no preauricular, no posterior auricular and no occipital adenopathy present.    She has no cervical adenopathy.       Right: No supraclavicular adenopathy present.       Left: No supraclavicular adenopathy present.  Neurological: She is alert and oriented to person, place, and time. No sensory deficit.  Skin: Skin is warm, dry and intact. No rash noted. No cyanosis or erythema. Nails show no clubbing.  Psychiatric: She has a normal mood and affect. Her speech is normal and behavior is normal.           Assessment & Plan:   1. ADD (attention deficit disorder) Stable, controlled. Continue current treatment. - amphetamine-dextroamphetamine (ADDERALL XR) 25 MG 24 hr capsule; Take 1 capsule by mouth every morning.  Dispense: 30 capsule; Refill: 0 - amphetamine-dextroamphetamine (ADDERALL XR) 25 MG 24 hr capsule; Take 1 capsule by mouth every morning.  Dispense: 30 capsule; Refill: 0 - amphetamine-dextroamphetamine (ADDERALL XR) 25 MG 24 hr capsule; Take 1 capsule by mouth every morning.  Dispense: 30 capsule; Refill: 0  2. Asthma, mild persistent, uncomplicated Stable, controlled. Continue current treatment. - montelukast (SINGULAIR) 10 MG tablet; TAKE 1 TABLET BY MOUTH EVERY NIGHT AT BEDTIME  Dispense: 90 tablet; Refill: 3 - Fluticasone-Salmeterol (ADVAIR DISKUS) 250-50 MCG/DOSE AEPB; INHALE 1 PUFF BY MOUTH EVERY 12 HOURS  Dispense: 60 each; Refill: 12  3. Other seasonal allergic rhinitis Worse this spring than previously, less well controlled than usual. Continue current treatment, with additional oral antihistamine PRN. - montelukast (SINGULAIR) 10 MG tablet; TAKE 1 TABLET BY MOUTH EVERY NIGHT AT BEDTIME  Dispense: 90 tablet; Refill: 3  4. Gastroesophageal reflux disease, esophagitis presence not specified Increase ranitidine to BID. Refer for additional evaluation with GI given her family history of esophageal stricture. -  Ambulatory referral to Gastroenterology   Fernande Brashelle S. Festus Pursel, PA-C Physician Assistant-Certified [facility]

## 2015-09-21 ENCOUNTER — Telehealth: Payer: Self-pay

## 2015-09-21 DIAGNOSIS — F988 Other specified behavioral and emotional disorders with onset usually occurring in childhood and adolescence: Secondary | ICD-10-CM

## 2015-09-21 NOTE — Telephone Encounter (Signed)
Pt is needing a refill on her adderall   Best number [phone]

## 2015-09-22 MED ORDER — AMPHETAMINE-DEXTROAMPHET ER 25 MG PO CP24: 25.0000 mg | ORAL_CAPSULE | ORAL | 0 refills | Status: DC

## 2015-09-22 NOTE — Telephone Encounter (Signed)
Meds ordered this encounter  Medications  . amphetamine-dextroamphetamine (ADDERALL XR) 25 MG 24 hr capsule    Sig: Take 1 capsule by mouth every morning.    Dispense:  30 capsule    Refill:  0  . amphetamine-dextroamphetamine (ADDERALL XR) 25 MG 24 hr capsule    Sig: Take 1 capsule by mouth every morning.    Dispense:  30 capsule    Refill:  0    May fill 30 days after date on this prescription  . amphetamine-dextroamphetamine (ADDERALL XR) 25 MG 24 hr capsule    Sig: Take 1 capsule by mouth every morning.    Dispense:  30 capsule    Refill:  0    May fill 60 days after date on this prescription

## 2015-09-22 NOTE — Telephone Encounter (Signed)
Last OV for ADD 5/25. Three Rxs written at OV. Will pend 3 more.

## 2015-09-23 NOTE — Telephone Encounter (Signed)
LMOM to advise ready and need for f/up before these run out.

## 2015-11-06 ENCOUNTER — Encounter (HOSPITAL_COMMUNITY): Payer: Self-pay | Admitting: Emergency Medicine

## 2015-11-06 ENCOUNTER — Emergency Department (HOSPITAL_COMMUNITY)
Admission: EM | Admit: 2015-11-06 | Discharge: 2015-11-06 | Disposition: A | Discharge status: Home / Self Care | Payer: BC Managed Care – PPO | Attending: Emergency Medicine | Admitting: Emergency Medicine

## 2015-11-06 ENCOUNTER — Emergency Department (HOSPITAL_COMMUNITY): Payer: BC Managed Care – PPO

## 2015-11-06 DIAGNOSIS — F909 Attention-deficit hyperactivity disorder, unspecified type: Secondary | ICD-10-CM | POA: Diagnosis not present

## 2015-11-06 DIAGNOSIS — J45909 Unspecified asthma, uncomplicated: Secondary | ICD-10-CM | POA: Insufficient documentation

## 2015-11-06 DIAGNOSIS — R1013 Epigastric pain: Secondary | ICD-10-CM

## 2015-11-06 DIAGNOSIS — R945 Abnormal results of liver function studies: Secondary | ICD-10-CM | POA: Insufficient documentation

## 2015-11-06 DIAGNOSIS — Z792 Long term (current) use of antibiotics: Secondary | ICD-10-CM | POA: Insufficient documentation

## 2015-11-06 DIAGNOSIS — E876 Hypokalemia: Secondary | ICD-10-CM

## 2015-11-06 DIAGNOSIS — Z91010 Allergy to peanuts: Secondary | ICD-10-CM | POA: Diagnosis not present

## 2015-11-06 DIAGNOSIS — Z79899 Other long term (current) drug therapy: Secondary | ICD-10-CM | POA: Diagnosis not present

## 2015-11-06 DIAGNOSIS — R748 Abnormal levels of other serum enzymes: Secondary | ICD-10-CM

## 2015-11-06 LAB — COMPREHENSIVE METABOLIC PANEL
ALBUMIN: 3.4 g/dL — AB (ref 3.5–5.0)
ALK PHOS: 53 U/L (ref 38–126)
ALT: 68 U/L — ABNORMAL HIGH (ref 14–54)
AST: 160 U/L — AB (ref 15–41)
Anion gap: 3 — ABNORMAL LOW (ref 5–15)
BILIRUBIN TOTAL: 1.8 mg/dL — AB (ref 0.3–1.2)
BUN: 10 mg/dL (ref 6–20)
CALCIUM: 7.6 mg/dL — AB (ref 8.9–10.3)
CO2: 23 mmol/L (ref 22–32)
Chloride: 112 mmol/L — ABNORMAL HIGH (ref 101–111)
Creatinine, Ser: 0.7 mg/dL (ref 0.44–1.00)
GFR calc Af Amer: >60 mL/min (ref >60)
GFR calc non Af Amer: >60 mL/min (ref >60)
GLUCOSE: 100 mg/dL — AB (ref 65–99)
Potassium: 2.6 mmol/L — CL (ref 3.5–5.1)
Sodium: 138 mmol/L (ref 135–145)
TOTAL PROTEIN: 5.6 g/dL — AB (ref 6.5–8.1)

## 2015-11-06 LAB — CBC
HCT: 35 % — ABNORMAL LOW (ref 36.0–46.0)
Hemoglobin: 12.3 g/dL (ref 12.0–15.0)
MCH: 31.9 pg (ref 26.0–34.0)
MCHC: 35.1 g/dL (ref 30.0–36.0)
MCV: 90.7 fL (ref 78.0–100.0)
Platelets: 171 10*3/uL (ref 150–400)
RBC: 3.86 MIL/uL — ABNORMAL LOW (ref 3.87–5.11)
RDW: 12.2 % (ref 11.5–15.5)
WBC: 7.1 10*3/uL (ref 4.0–10.5)

## 2015-11-06 LAB — URINALYSIS, ROUTINE W REFLEX MICROSCOPIC
BILIRUBIN URINE: NEGATIVE
Glucose, UA: NEGATIVE mg/dL
KETONES UR: NEGATIVE mg/dL
NITRITE: POSITIVE — AB
PH: 5.5 (ref 5.0–8.0)
Protein, ur: NEGATIVE mg/dL
Specific Gravity, Urine: 1.018 (ref 1.005–1.030)

## 2015-11-06 LAB — I-STAT BETA HCG BLOOD, ED (MC, WL, AP ONLY)

## 2015-11-06 LAB — LIPASE, BLOOD: Lipase: 22 U/L (ref 11–51)

## 2015-11-06 LAB — URINE MICROSCOPIC-ADD ON

## 2015-11-06 LAB — POC URINE PREG, ED: PREG TEST UR: NEGATIVE

## 2015-11-06 MED ORDER — TRAMADOL HCL 50 MG PO TABS: 100.0000 mg | ORAL_TABLET | Freq: Three times a day (TID) | ORAL | 0 refills | Status: DC | PRN

## 2015-11-06 MED ORDER — ACETAMINOPHEN ER 650 MG PO TBCR: 650.0000 mg | EXTENDED_RELEASE_TABLET | Freq: Three times a day (TID) | ORAL | 0 refills | Status: DC | PRN

## 2015-11-06 MED ORDER — RANITIDINE HCL 150 MG PO TABS: 150.0000 mg | ORAL_TABLET | Freq: Two times a day (BID) | ORAL | 0 refills | Status: DC

## 2015-11-06 MED ORDER — MORPHINE SULFATE (PF) 4 MG/ML IV SOLN
4.0000 mg | Freq: Once | INTRAVENOUS | Status: AC
  Administered 2015-11-06: 4 mg via INTRAVENOUS
  Filled 2015-11-06: qty 1

## 2015-11-06 MED ORDER — POTASSIUM CHLORIDE CRYS ER 20 MEQ PO TBCR
40.0000 meq | EXTENDED_RELEASE_TABLET | Freq: Once | ORAL | Status: AC
  Administered 2015-11-06: 40 meq via ORAL
  Filled 2015-11-06: qty 2

## 2015-11-06 MED ORDER — ONDANSETRON HCL 4 MG/2ML IJ SOLN
4.0000 mg | Freq: Once | INTRAMUSCULAR | Status: AC | PRN
  Administered 2015-11-06: 4 mg via INTRAVENOUS
  Filled 2015-11-06: qty 2

## 2015-11-06 MED ORDER — POTASSIUM CHLORIDE CRYS ER 20 MEQ PO TBCR
60.0000 meq | EXTENDED_RELEASE_TABLET | Freq: Once | ORAL | Status: AC
Start: 2015-11-06 — End: 2015-11-06
  Administered 2015-11-06: 60 meq via ORAL
  Filled 2015-11-06: qty 3

## 2015-11-06 MED ORDER — GI COCKTAIL ~~LOC~~
30.0000 mL | Freq: Once | ORAL | Status: AC
  Administered 2015-11-06: 30 mL via ORAL
  Filled 2015-11-06: qty 30

## 2015-11-06 MED ORDER — SODIUM CHLORIDE 0.9 % IV BOLUS (SEPSIS)
1000.0000 mL | Freq: Once | INTRAVENOUS | Status: AC
  Administered 2015-11-06: 1000 mL via INTRAVENOUS

## 2015-11-06 MED ORDER — POTASSIUM CHLORIDE 10 MEQ/100ML IV SOLN
10.0000 meq | INTRAVENOUS | Status: DC
  Administered 2015-11-06: 10 meq via INTRAVENOUS
  Filled 2015-11-06: qty 100

## 2015-11-06 NOTE — Discharge Instructions (Signed)
We saw you in the ER for the abdominal pain. ALTHOUGH THE LIVER ENZYMES WERE SLIGHTLY ELEVATED - the ultrasound of the liver is normal and there were no gall stones.  We are not sure what caused your abdominal pain, and recommend that you see your primary care doctor within 2-3 days for further evaluation. If your symptoms get worse, return to the ER.  PLEASE SEE THE GI DOCTOR FOR FURTHER EVALUATION.

## 2015-11-06 NOTE — ED Notes (Signed)
Bed: [id number] Expected date:  Expected time:  Means of arrival:  Comments: 25 yo F abd pain

## 2015-11-06 NOTE — ED Provider Notes (Signed)
[facility]-EMERGENCY DEPT Provider Note   CSN: [id number] Arrival date & time: 11/06/15  [id number]  By signing my name below, I, Sherry Estrada, attest that this documentation has been prepared under the direction and in the presence of Derwood KaplanAnkit Kalon Erhardt, MD . Electronically Signed: Modena JanskyAlbert Estrada, Scribe. 11/06/2015. 3:34 AM.  History   Chief Complaint Chief Complaint  Patient presents with  . Abdominal Pain   The history is provided by the patient. No language interpreter was used.   HPI Comments: Sherry Estrada is a 25 y.o. female with a hx of chronic abdominal pain since birth who presents to the Emergency Department complaining of constant moderate epigastric abdominal pain that started about 2 hours ago. She states she woken up by pain that was not present before going to bed. She describes the pain as radiating into her upper back and unrelieved by Zantac. She reports associated symptoms of diaphoresis, warm feeling, chest tightness, and 4 episodes of vomiting. She states her last BM was yesterday afternoon, described as normal appearing without any blood. She reports her menstrual cycle was 2 weeks ago. She admits to a hx of appendectomy. She denies any hx of ovarian cyst, ibuprofen use, heavy alcohol use, hx of smoking, chance of pregnancy, or diarrhea.   Past Medical History:  Diagnosis Date  . ADD (attention deficit disorder)   . Allergy   . Asthma   . Chronic sinusitis   . PONV (postoperative nausea and vomiting)     Patient Active Problem List   Diagnosis Date Noted  . GERD (gastroesophageal reflux disease) 06/26/2015  . ADD (attention deficit disorder) 11/06/2011  . Allergic rhinitis 09/28/2011  . Asthma 05/02/2011    Past Surgical History:  Procedure Laterality Date  . APPENDECTOMY    . NASAL SINUS SURGERY Bilateral 10/29/2012   Procedure: BILATERAL ENDOSCOPIC SINUS SURGERY/BILATERAL PARTIAL INFERIOR TURBINATE RESECTION;  Surgeon: Darletta MollSui W Teoh, MD;  Location: [facility];  Service: ENT;  Laterality: Bilateral;  . TURBINATE REDUCTION Bilateral 10/29/2012   Procedure: BILATERAL ENDOSCOPIC TOTAL ETHMOIDECTOMY/ BILATERAL MAXILLARY ANTROSTOMY ;  Surgeon: Darletta MollSui W Teoh, MD;  Location: [facility];  Service: ENT;  Laterality: Bilateral;  . WISDOM TOOTH EXTRACTION      OB History    No data available       Home Medications    Prior to Admission medications   Medication Sig Start Date End Date Taking? Authorizing Provider  amphetamine-dextroamphetamine (ADDERALL XR) 25 MG 24 hr capsule Take 1 capsule by mouth every morning. 09/22/15  Yes Chelle Jeffery, PA-C  ampicillin (PRINCIPEN) 500 MG capsule Take 500 mg by mouth daily.    Yes Historical Provider, MD  EPINEPHrine (EPIPEN JR) 0.15 MG/0.3ML injection Inject 0.15 mg into the muscle as needed.   Yes Historical Provider, MD  esomeprazole (NEXIUM) 20 MG capsule Take 20 mg by mouth daily at 12 noon.   Yes Historical Provider, MD  Fluticasone-Salmeterol (ADVAIR DISKUS) 250-50 MCG/DOSE AEPB INHALE 1 PUFF BY MOUTH EVERY 12 HOURS 06/26/15  Yes Chelle Jeffery, PA-C  loratadine (CLARITIN) 10 MG tablet Take 10 mg by mouth once.   Yes Historical Provider, MD  montelukast (SINGULAIR) 10 MG tablet TAKE 1 TABLET BY MOUTH EVERY NIGHT AT BEDTIME 06/26/15  Yes Chelle Jeffery, PA-C  PROVENTIL HFA 108 (90 BASE) MCG/ACT inhaler INHALE 1 PUFF INTO THE LUNGS EVERY 6 HOURS AS NEEDED FOR WHEEZING 06/14/14  Yes Chelle Jeffery, PA-C  Pseudoeph-Doxylamine-DM-APAP (NYQUIL PO) Take 1 capsule by mouth once.  Yes Historical Provider, MD  tretinoin (RETIN-A) 0.025 % cream APPLY AA OF FACE QPM 05/23/15  Yes Historical Provider, MD  amphetamine-dextroamphetamine (ADDERALL XR) 25 MG 24 hr capsule Take 1 capsule by mouth every morning. Patient not taking: Reported on 11/06/2015 09/22/15   Porfirio Oar, PA-C  amphetamine-dextroamphetamine (ADDERALL XR) 25 MG 24 hr capsule Take 1 capsule by mouth every morning. Patient not taking:  Reported on 11/06/2015 09/22/15   Porfirio Oar, PA-C  ranitidine (ZANTAC) 150 MG tablet Take 1 tablet (150 mg total) by mouth 2 (two) times daily. 11/06/15   Derwood Kaplan, MD    Family History Family History  Problem Relation Age of Onset  . Cancer Mother 25    melanoma  . Cancer Maternal Grandfather   . Heart disease Paternal Grandfather     Social History Social History  Substance Use Topics  . Smoking status: Never Smoker  . Smokeless tobacco: Never Used  . Alcohol use 0.0 - 1.2 oz/week     Comment: once every 3 months     Allergies   Peanut-containing drug products   Review of Systems Review of Systems 10 Systems reviewed and all are negative for acute change except as noted in the HPI.  Physical Exam Updated Vital Signs BP 102/59   Pulse 120   Temp 98.4 F (36.9 C) (Oral)   Resp 18   SpO2 100%   Physical Exam  Constitutional: She appears well-developed and well-nourished. No distress.  HENT:  Head: Normocephalic and atraumatic.  Right Ear: External ear normal.  Left Ear: External ear normal.  Mucous membrane dry.   Eyes: Conjunctivae are normal. Right eye exhibits no discharge. Left eye exhibits no discharge. No scleral icterus.  Neck: Neck supple. No tracheal deviation present.  Cardiovascular: Normal rate and regular rhythm.   Pulmonary/Chest: Effort normal. No stridor. No respiratory distress.  Abdominal: Bowel sounds are normal. She exhibits no distension. There is tenderness. There is guarding.  Positive bowel sounds. TTP over the LUQ and epigastrium with guarding. Negative Murphy's sign. No flank TTP.   Musculoskeletal: She exhibits no edema.  No reproducible midline thoracic spine TTP.   Neurological: She is alert. Cranial nerve deficit: no gross deficits.  Skin: Skin is dry. No rash noted.  Skin is cool to touch.   Psychiatric: She has a normal mood and affect.  Nursing note and vitals reviewed.    ED Treatments / Results  DIAGNOSTIC  STUDIES: Oxygen Saturation is 100% on RA, normal by my interpretation.    COORDINATION OF CARE: 3:40 AM- Pt advised of plan for treatment and pt agrees.  Labs (all labs ordered are listed, but only abnormal results are displayed) Labs Reviewed  COMPREHENSIVE METABOLIC PANEL - Abnormal; Notable for the following:       Result Value   Potassium 2.6 (*)    Chloride 112 (*)    Glucose, Bld 100 (*)    Calcium 7.6 (*)    Total Protein 5.6 (*)    Albumin 3.4 (*)    AST 160 (*)    ALT 68 (*)    Total Bilirubin 1.8 (*)    Anion gap 3 (*)    All other components within normal limits  CBC - Abnormal; Notable for the following:    RBC 3.86 (*)    HCT 35.0 (*)    All other components within normal limits  LIPASE, BLOOD  URINALYSIS, ROUTINE W REFLEX MICROSCOPIC (NOT AT [facility])  I-STAT BETA HCG BLOOD, ED ([facility],  [facility], [facility] ONLY)  POC URINE PREG, ED    EKG  EKG Interpretation None       Radiology US Abdomen Limited Ruq  Result Date: 11/06/2015 CLINICAL DATA:  Epigastric and abdominal pain for 4 hours, nausea and vomiting. EXAM: US ABDOMEN LIMITED - RIGHT UPPER QUADRANT COMPARISON:  None. FINDINGS: Gallbladder: No gallstones or wall thickening visualized. No sonographic Murphy sign noted by sonographer. Common bile duct: Diameter:  3 mm Liver: No focal lesion identified. Within normal limits in parenchymal echogenicity. Hepatopetal portal vein. IMPRESSION: Normal RIGHT upper quadrant ultrasound. Electronically Signed   By: Awilda Metro M.D.   On: 11/06/2015 04:47    Procedures Procedures (including critical care time)  Medications Ordered in ED Medications  potassium chloride SA (K-DUR,KLOR-CON) CR tablet 40 mEq (not administered)  ondansetron (ZOFRAN) injection 4 mg (4 mg Intravenous Given 11/06/15 0409)  morphine 4 MG/ML injection 4 mg (4 mg Intravenous Given 11/06/15 0405)  sodium chloride 0.9 % bolus 1,000 mL (1,000 mLs Intravenous New Bag/Given 11/06/15 0405)  potassium chloride SA  (K-DUR,KLOR-CON) CR tablet 60 mEq (60 mEq Oral Given 11/06/15 0604)     Initial Impression / Assessment and Plan / ED Course  I have reviewed the triage vital signs and the nursing notes.  Pertinent labs & imaging results that were available during my care of the patient were reviewed by me and considered in my medical decision making (see chart for details).  Clinical Course  Comment By Time  Pt reassessed. [place] results discussed. She has NO ABDOMINAL pain. We discussed the elevated liver enzymes as well. Strict ER return precautions have been discussed, and patient is agreeing with the plan and is comfortable with the workup done and the recommendations from the ER. Pt encouraged to see GI doctor. Derwood Kaplan, MD 10/06 0600    DDx includes: Pancreatitis Hepatobiliary pathology including cholecystitis Gastritis/PUD SBO  Pt comes in with epigastric pain. She has epigastric and upper quadrant tenderness, neg Murphy's. Clinical concerns still high for cholelithiasis or pancreatitis. No risk factors for ACS, dissection or pancreatitis. Upreg is neg. Pain is not pelvic in origin - so we dont think torsion, PID, TOA are likely.  No risk factors for thrombosis....  We will reassess post [place], if still in pain will consider pelvic workup or CT scan.   Final Clinical Impressions(s) / ED Diagnoses   Final diagnoses:  Epigastric abdominal pain  Hypokalemia  Acute epigastric pain  Elevated liver enzymes    New Prescriptions New Prescriptions   RANITIDINE (ZANTAC) 150 MG TABLET    Take 1 tablet (150 mg total) by mouth 2 (two) times daily.   I personally performed the services described in this documentation, which was scribed in my presence. The recorded information has been reviewed and is accurate.     Derwood Kaplan, MD 11/06/15 [phone]

## 2015-11-06 NOTE — ED Triage Notes (Signed)
Pt work up 1 am, c/o epigastric pain 10 out 10, vomited 3 times, nausea. Complain of back pain as well,.  zofran 4mg  given. rac 20,  V/s 122/78, hr 70, cbg 109, rr24  Hx asthma.

## 2015-11-06 NOTE — ED Notes (Signed)
Nurse will attempt to draw labs with IV start. 

## 2015-11-20 ENCOUNTER — Other Ambulatory Visit (HOSPITAL_COMMUNITY): Payer: Self-pay | Admitting: Gastroenterology

## 2015-11-20 DIAGNOSIS — R0789 Other chest pain: Secondary | ICD-10-CM

## 2015-11-20 DIAGNOSIS — R1013 Epigastric pain: Secondary | ICD-10-CM

## 2015-11-20 DIAGNOSIS — R1310 Dysphagia, unspecified: Secondary | ICD-10-CM

## 2015-11-23 ENCOUNTER — Ambulatory Visit (HOSPITAL_COMMUNITY)
Admission: RE | Admit: 2015-11-23 | Discharge: 2015-11-23 | Disposition: A | Discharge status: Home / Self Care | Payer: BC Managed Care – PPO | Source: Ambulatory Visit | Attending: Gastroenterology | Admitting: Gastroenterology

## 2015-11-23 DIAGNOSIS — R1310 Dysphagia, unspecified: Secondary | ICD-10-CM | POA: Insufficient documentation

## 2015-11-23 DIAGNOSIS — R0789 Other chest pain: Secondary | ICD-10-CM

## 2015-11-23 DIAGNOSIS — R1013 Epigastric pain: Secondary | ICD-10-CM | POA: Diagnosis not present

## 2015-12-19 ENCOUNTER — Ambulatory Visit (INDEPENDENT_AMBULATORY_CARE_PROVIDER_SITE_OTHER): Payer: BC Managed Care – PPO | Admitting: Physician Assistant

## 2015-12-19 VITALS — BP 122/72 | HR 83 | Temp 97.8°F | Resp 17 | Ht 67.0 in | Wt 137.0 lb

## 2015-12-19 DIAGNOSIS — F988 Other specified behavioral and emotional disorders with onset usually occurring in childhood and adolescence: Secondary | ICD-10-CM

## 2015-12-19 DIAGNOSIS — Z91018 Allergy to other foods: Secondary | ICD-10-CM | POA: Insufficient documentation

## 2015-12-19 MED ORDER — AMPHETAMINE-DEXTROAMPHETAMINE 10 MG PO TABS: 10.0000 mg | ORAL_TABLET | Freq: Every day | ORAL | 0 refills | Status: DC

## 2015-12-19 MED ORDER — EPINEPHRINE 0.3 MG/0.3ML IJ SOAJ: 0.3000 mg | Freq: Once | INTRAMUSCULAR | 2 refills | Status: AC

## 2015-12-19 MED ORDER — AMPHETAMINE-DEXTROAMPHET ER 25 MG PO CP24: 25.0000 mg | ORAL_CAPSULE | ORAL | 0 refills | Status: DC

## 2015-12-19 NOTE — Progress Notes (Signed)
Subjective:    Patient ID: Sherry Estrada, female    DOB: Jun 12, 1990, 25 y.o.   MRN: [id number]  Chief Complaint  Patient presents with  . Medication Refill    ADDERALL, EPI pen    HPI: Presents for medication refill. Patient is currently taking 25 mg of Adderall XR each morning around 6am and notices it wearing off around 12pm each dose. Feels like she may need an additional dose for the afternoons. 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 Takes it around 8-9am on the weekends and states she does not notice it wearing off as much. Denies appetite suppression or trouble with sleeping. States she had these side effects when first starting the medication but no longer has them. Denies palpitations, SOB, chest pain.  She also uses an Epipen for a tree nut allergy and it has expired, would like a refill here if possible.  She had an ER visit 1.5 months ago for severe abdominal pain and was diagnosed with hypokalemia and elevated liver enzymes. She has been following up with GI, Dr. Dulce Sellarutlaw, and states that all of the tests have come back negative but they are still trying to determine what caused the hypokalemia. She currently continues to take a Magnesium supplement daily. Denies any current GI symptoms, see ROS below.  Review of Systems  Constitutional: Negative for chills, fatigue and fever.  HENT: Positive for postnasal drip and rhinorrhea. Negative for congestion, ear pain, sinus pain, sinus pressure and sore throat.   Eyes: Negative for pain, discharge, redness and itching.  Respiratory: Negative for cough, chest tightness, shortness of breath and wheezing.   Cardiovascular: Negative for chest pain and palpitations.  Gastrointestinal: Negative for abdominal pain, blood in stool, constipation, diarrhea, nausea and vomiting.  Genitourinary: Negative for dysuria, frequency, hematuria and urgency.  Allergic/Immunologic: Positive for environmental allergies.  Neurological: Negative for  dizziness, weakness and headaches.  Psychiatric/Behavioral: Negative for dysphoric mood.   Allergies  Allergen Reactions  . Peanut-Containing Drug Products Anaphylaxis    Pt states she had anaphylaxis to tree nuts    Prior to Admission medications   Medication Sig Start Date End Date Taking? Authorizing Provider  amphetamine-dextroamphetamine (ADDERALL XR) 25 MG 24 hr capsule Take 1 capsule by mouth every morning. 09/22/15  Yes Chelle Jeffery, PA-C  amphetamine-dextroamphetamine (ADDERALL XR) 25 MG 24 hr capsule Take 1 capsule by mouth every morning. 09/22/15  Yes Chelle Jeffery, PA-C  amphetamine-dextroamphetamine (ADDERALL XR) 25 MG 24 hr capsule Take 1 capsule by mouth every morning. 09/22/15  Yes Chelle Jeffery, PA-C  ampicillin (PRINCIPEN) 500 MG capsule Take 500 mg by mouth daily.    Yes Historical Provider, MD  EPINEPHrine (EPIPEN JR) 0.15 MG/0.3ML injection Inject 0.15 mg into the muscle as needed.   Yes Historical Provider, MD  Fluticasone-Salmeterol (ADVAIR DISKUS) 250-50 MCG/DOSE AEPB INHALE 1 PUFF BY MOUTH EVERY 12 HOURS 06/26/15  Yes Chelle Jeffery, PA-C  loratadine (CLARITIN) 10 MG tablet Take 10 mg by mouth once.   Yes Historical Provider, MD  montelukast (SINGULAIR) 10 MG tablet TAKE 1 TABLET BY MOUTH EVERY NIGHT AT BEDTIME 06/26/15  Yes Chelle Jeffery, PA-C  PROVENTIL HFA 108 (90 BASE) MCG/ACT inhaler INHALE 1 PUFF INTO THE LUNGS EVERY 6 HOURS AS NEEDED FOR WHEEZING 06/14/14  Yes Chelle Jeffery, PA-C  ranitidine (ZANTAC) 150 MG tablet Take 1 tablet (150 mg total) by mouth 2 (two) times daily. 11/06/15  Yes Ankit Nanavati, MD  tretinoin (RETIN-A) 0.025 % cream APPLY AA OF  FACE QPM 05/23/15  Yes Historical Provider, MD   Patient Active Problem List   Diagnosis Date Noted  . GERD (gastroesophageal reflux disease) 06/26/2015  . ADD (attention deficit disorder) 11/06/2011  . Allergic rhinitis 09/28/2011  . Asthma 05/02/2011      Objective: Blood pressure 122/72, pulse 83,  temperature 97.8 F (36.6 C), temperature source Oral, resp. rate 17, height 5\' 7"  (1.702 m), weight 137 lb (62.1 kg), last menstrual period 12/09/2015, SpO2 100 %.   Physical Exam  Constitutional: She is oriented to person, place, and time. She appears well-developed and well-nourished. No distress.  HENT:  Head: Normocephalic and atraumatic.  Mouth/Throat: Oropharynx is clear and moist. No oropharyngeal exudate.  Eyes: Conjunctivae are normal. Pupils are equal, round, and reactive to light. Right eye exhibits no discharge. Left eye exhibits no discharge. No scleral icterus.  Neck: Normal range of motion. Neck supple. No tracheal deviation present. No thyromegaly present.  Cardiovascular: Normal rate, regular rhythm, normal heart sounds and intact distal pulses.  Exam reveals no gallop and no friction rub.   No murmur heard. Pulmonary/Chest: Effort normal and breath sounds normal. No stridor. No respiratory distress. She has no wheezes. She has no rales.  Lymphadenopathy:    She has no cervical adenopathy.  Neurological: She is alert and oriented to person, place, and time.  Skin: Skin is warm and dry. No rash noted. She is not diaphoretic. No erythema.  Psychiatric: She has a normal mood and affect. Her behavior is normal.       Assessment & Plan:  1. Attention deficit disorder (ADD) without hyperactivity Advised patient to continue 25 mg Adderall XR each morning and added 10 mg Adderall at noon each day to last throughout the afternoon. Advised patient to RTC if this medication regimen is not working. - amphetamine-dextroamphetamine (ADDERALL XR) 25 MG 24 hr capsule; Take 1 capsule by mouth every morning.  Dispense: 30 capsule; Refill: 0 - amphetamine-dextroamphetamine (ADDERALL XR) 25 MG 24 hr capsule; Take 1 capsule by mouth every morning.  Dispense: 30 capsule; Refill: 0 - amphetamine-dextroamphetamine (ADDERALL XR) 25 MG 24 hr capsule; Take 1 capsule by mouth every morning.  Dispense:  30 capsule; Refill: 0 - amphetamine-dextroamphetamine (ADDERALL) 10 MG tablet; Take 1 tablet (10 mg total) by mouth daily before breakfast.  Dispense: 30 tablet; Refill: 0 - amphetamine-dextroamphetamine (ADDERALL) 10 MG tablet; Take 1 tablet (10 mg total) by mouth daily before breakfast.  Dispense: 30 tablet; Refill: 0 - amphetamine-dextroamphetamine (ADDERALL) 10 MG tablet; Take 1 tablet (10 mg total) by mouth daily before breakfast.  Dispense: 30 tablet; Refill: 0  2. Tree nut allergy Refill of Epipen provided. - EPINEPHrine 0.3 mg/0.3 mL IJ SOAJ injection; Inject 0.3 mLs (0.3 mg total) into the muscle once.  Dispense: 1 Device; Refill: 2

## 2015-12-19 NOTE — Patient Instructions (Signed)
     IF you received an x-ray today, you will receive an invoice from [facility]. Please contact [facility] at [phone] with questions or concerns regarding your invoice.   IF you received labwork today, you will receive an invoice from [facility]. Please contact [facility] at [phone] with questions or concerns regarding your invoice.   Our billing staff will not be able to assist you with questions regarding bills from these companies.  You will be contacted with the lab results as soon as they are available. The fastest way to get your results is to activate your My Chart account. Instructions are located on the last page of this paperwork. If you have not heard from us regarding the results in 2 weeks, please contact this office.      

## 2015-12-19 NOTE — Progress Notes (Signed)
Patient ID: Sherry Estrada, female    DOB: [birth year], 25 y.o.   MRN: [id number]  PCP: Porfirio Oarhelle Emanuel Dowson, PA-C  Chief Complaint  Patient presents with  . Medication Refill    ADDERALL, EPI pen     Subjective:   Presents for evaluation of ADD.  She's been on the current regimen of the extended release product for some time now, but notes that it wears off too soon. She takes the morning dose about 6 am, and effects are gone by noon. This isn't adequate as her job as a Runner, broadcasting/film/videoteacher goes until late afternoon. 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  Otherwise, she tolerates the treatment well, without loss of appetite, abdominal pain or disrupted sleep. She does note that on weekends, if the takes the XR too late, she has difficulty sleeping that night.    Review of Systems  As above.   Patient Active Problem List   Diagnosis Date Noted  . GERD (gastroesophageal reflux disease) 06/26/2015  . ADD (attention deficit disorder) 11/06/2011  . Allergic rhinitis 09/28/2011  . Asthma 05/02/2011     Prior to Admission medications   Medication Sig Start Date End Date Taking? Authorizing Provider  amphetamine-dextroamphetamine (ADDERALL XR) 25 MG 24 hr capsule Take 1 capsule by mouth every morning. 09/22/15  Yes Jenna Routzahn, PA-C  amphetamine-dextroamphetamine (ADDERALL XR) 25 MG 24 hr capsule Take 1 capsule by mouth every morning. 09/22/15  Yes Khyleigh Furney, PA-C  amphetamine-dextroamphetamine (ADDERALL XR) 25 MG 24 hr capsule Take 1 capsule by mouth every morning. 09/22/15  Yes Syvanna Ciolino, PA-C  ampicillin (PRINCIPEN) 500 MG capsule Take 500 mg by mouth daily.    Yes Historical Provider, MD  EPINEPHrine (EPIPEN JR) 0.15 MG/0.3ML injection Inject 0.15 mg into the muscle as needed.   Yes Historical Provider, MD  Fluticasone-Salmeterol (ADVAIR  DISKUS) 250-50 MCG/DOSE AEPB INHALE 1 PUFF BY MOUTH EVERY 12 HOURS 06/26/15  Yes Myka Lukins, PA-C  loratadine (CLARITIN) 10 MG tablet Take 10 mg by mouth once.   Yes Historical Provider, MD  montelukast (SINGULAIR) 10 MG tablet TAKE 1 TABLET BY MOUTH EVERY NIGHT AT BEDTIME 06/26/15  Yes Jozette Castrellon, PA-C  PROVENTIL HFA 108 (90 BASE) MCG/ACT inhaler INHALE 1 PUFF INTO THE LUNGS EVERY 6 HOURS AS NEEDED FOR WHEEZING 06/14/14  Yes Sabah Zucco, PA-C  ranitidine (ZANTAC) 150 MG tablet Take 1 tablet (150 mg total) by mouth 2 (two) times daily. 11/06/15  Yes Ankit Rhunette CroftNanavati, MD  tretinoin (RETIN-A) 0.025 % cream APPLY AA OF FACE QPM 05/23/15  Yes Historical Provider, MD     Allergies  Allergen Reactions  . Peanut-Containing Drug Products Anaphylaxis    Pt states she had anaphylaxis to tree nuts        Objective:  Physical Exam  Constitutional: She is oriented to person, place, and time. She appears well-developed and well-nourished. She is active and cooperative. No distress.  BP 122/72 (BP Location: Right Arm, Patient Position: Sitting, Cuff Size: Normal)   Pulse 83   Temp 97.8 F (36.6 C) (Oral)   Resp 17   Ht 5\' 7"  (1.702 m)   Wt 137 lb (62.1 kg)   LMP 12/09/2015 (Approximate)   SpO2 100%   BMI 21.46 kg/m    Eyes: Conjunctivae are normal.  Pulmonary/Chest: Effort normal.  Neurological: She is alert and oriented  to person, place, and time.  Psychiatric: She has a normal mood and affect. Her speech is normal and behavior is normal.           Assessment & Plan:   1. Attention deficit disorder (ADD) without hyperactivity Discussed options. Continue XR product Q AM. Add IR product at noon, as needed. If this isn't effective, could increase the IR dose, or change to Mydais. She'll let me know. - amphetamine-dextroamphetamine (ADDERALL XR) 25 MG 24 hr capsule; Take 1 capsule by mouth every morning.  Dispense: 30 capsule; Refill: 0 - amphetamine-dextroamphetamine (ADDERALL XR)  25 MG 24 hr capsule; Take 1 capsule by mouth every morning.  Dispense: 30 capsule; Refill: 0 - amphetamine-dextroamphetamine (ADDERALL XR) 25 MG 24 hr capsule; Take 1 capsule by mouth every morning.  Dispense: 30 capsule; Refill: 0 - amphetamine-dextroamphetamine (ADDERALL) 10 MG tablet; Take 1 tablet (10 mg total) by mouth daily before breakfast.  Dispense: 30 tablet; Refill: 0 - amphetamine-dextroamphetamine (ADDERALL) 10 MG tablet; Take 1 tablet (10 mg total) by mouth daily before breakfast.  Dispense: 30 tablet; Refill: 0 - amphetamine-dextroamphetamine (ADDERALL) 10 MG tablet; Take 1 tablet (10 mg total) by mouth daily before breakfast.  Dispense: 30 tablet; Refill: 0  2. Tree nut allergy - EPINEPHrine 0.3 mg/0.3 mL IJ SOAJ injection; Inject 0.3 mLs (0.3 mg total) into the muscle once.  Dispense: 1 Device; Refill: 2   Fernande Brashelle S. Tereza Gilham, PA-C Physician Assistant-Certified [facility]

## 2016-01-02 ENCOUNTER — Telehealth: Payer: Self-pay

## 2016-01-02 NOTE — Telephone Encounter (Signed)
Patient states that the pharmacy faxed a prior authorization request for Adderral 10MG . Patient is calling to check on the status. Please advise!   [phone]

## 2016-01-05 NOTE — Telephone Encounter (Signed)
I have started a PA on covermymeds but am waiting for clin ?s to load.

## 2016-01-05 NOTE — Telephone Encounter (Signed)
Completed PA. Pending.

## 2016-01-05 NOTE — Telephone Encounter (Signed)
PA approved through 01/05/2019. Tried to notify pt but her VM was full. Asked pharm to advise pt when they have Rx ready.

## 2016-03-23 ENCOUNTER — Telehealth: Payer: Self-pay

## 2016-03-23 DIAGNOSIS — F988 Other specified behavioral and emotional disorders with onset usually occurring in childhood and adolescence: Secondary | ICD-10-CM

## 2016-03-23 NOTE — Telephone Encounter (Signed)
Pt called and states she needs a refill on her dextroamphetamine (ADDERALL) 10 MG tablet [[id number]][[id number]]  Please advise: [phone]

## 2016-03-23 NOTE — Telephone Encounter (Signed)
Last refill 01/2016 Last ov 12/2015

## 2016-03-24 MED ORDER — AMPHETAMINE-DEXTROAMPHET ER 25 MG PO CP24: 25.0000 mg | ORAL_CAPSULE | ORAL | 0 refills | Status: DC

## 2016-03-24 MED ORDER — AMPHETAMINE-DEXTROAMPHETAMINE 10 MG PO TABS: 10.0000 mg | ORAL_TABLET | Freq: Every day | ORAL | 0 refills | Status: DC

## 2016-03-24 NOTE — Telephone Encounter (Signed)
Meds ordered this encounter  Medications  . amphetamine-dextroamphetamine (ADDERALL XR) 25 MG 24 hr capsule    Sig: Take 1 capsule by mouth every morning.    Dispense:  30 capsule    Refill:  0    Order Specific Question:   Supervising Provider    Answer:   SHAW, EVA N [[id number]]  . DISCONTD: amphetamine-dextroamphetamine (ADDERALL) 10 MG tablet    Sig: Take 1 tablet (10 mg total) by mouth daily before breakfast.    Dispense:  30 tablet    Refill:  0    Order Specific Question:   Supervising Provider    Answer:   SHAW, EVA N [[id number]]  . amphetamine-dextroamphetamine (ADDERALL XR) 25 MG 24 hr capsule    Sig: Take 1 capsule by mouth every morning.    Dispense:  30 capsule    Refill:  0    May fill 30 days after date on this prescription    Order Specific Question:   Supervising Provider    Answer:   Clelia CroftSHAW, EVA N [[id number]]  . DISCONTD: amphetamine-dextroamphetamine (ADDERALL) 10 MG tablet    Sig: Take 1 tablet (10 mg total) by mouth daily before breakfast.    Dispense:  30 tablet    Refill:  0    May fill 30 days after date on prescription    Order Specific Question:   Supervising Provider    Answer:   Clelia CroftSHAW, EVA N [[id number]]  . amphetamine-dextroamphetamine (ADDERALL XR) 25 MG 24 hr capsule    Sig: Take 1 capsule by mouth every morning.    Dispense:  30 capsule    Refill:  0    May fill 60 days after date on this prescription    Order Specific Question:   Supervising Provider    Answer:   Clelia CroftSHAW, EVA N [[id number]]  . amphetamine-dextroamphetamine (ADDERALL) 10 MG tablet    Sig: Take 1 tablet (10 mg total) by mouth daily before breakfast.    Dispense:  30 tablet    Refill:  0    May fill 60 days after date on prescription    Order Specific Question:   Supervising Provider    Answer:   Sherren MochaSHAW, EVA N [[id number]]   Left message for patient that Rxs are ready for pick up.

## 2016-06-15 ENCOUNTER — Encounter: Payer: Self-pay | Admitting: Physician Assistant

## 2016-06-15 ENCOUNTER — Ambulatory Visit (INDEPENDENT_AMBULATORY_CARE_PROVIDER_SITE_OTHER): Payer: BC Managed Care – PPO | Admitting: Physician Assistant

## 2016-06-15 VITALS — BP 131/84 | HR 84 | Temp 98.9°F | Resp 17 | Ht 67.0 in | Wt 139.0 lb

## 2016-06-15 DIAGNOSIS — J302 Other seasonal allergic rhinitis: Secondary | ICD-10-CM | POA: Diagnosis not present

## 2016-06-15 DIAGNOSIS — F988 Other specified behavioral and emotional disorders with onset usually occurring in childhood and adolescence: Secondary | ICD-10-CM | POA: Diagnosis not present

## 2016-06-15 DIAGNOSIS — J45909 Unspecified asthma, uncomplicated: Secondary | ICD-10-CM | POA: Diagnosis not present

## 2016-06-15 DIAGNOSIS — J309 Allergic rhinitis, unspecified: Secondary | ICD-10-CM

## 2016-06-15 MED ORDER — AMPHETAMINE-DEXTROAMPHETAMINE 20 MG PO TABS: 20.0000 mg | ORAL_TABLET | Freq: Every day | ORAL | 0 refills | Status: DC

## 2016-06-15 MED ORDER — AMPHETAMINE-DEXTROAMPHET ER 25 MG PO CP24: 25.0000 mg | ORAL_CAPSULE | ORAL | 0 refills | Status: DC

## 2016-06-15 MED ORDER — ALBUTEROL SULFATE HFA 108 (90 BASE) MCG/ACT IN AERS: INHALATION_SPRAY | RESPIRATORY_TRACT | 2 refills | Status: DC

## 2016-06-15 MED ORDER — MONTELUKAST SODIUM 10 MG PO TABS: ORAL_TABLET | ORAL | 3 refills | Status: AC

## 2016-06-15 MED ORDER — AMPHETAMINE-DEXTROAMPHETAMINE 20 MG PO TABS: 10.0000 mg | ORAL_TABLET | Freq: Every day | ORAL | 0 refills | Status: DC

## 2016-06-15 MED ORDER — FLUTICASONE-SALMETEROL 250-50 MCG/DOSE IN AEPB: INHALATION_SPRAY | RESPIRATORY_TRACT | 12 refills | Status: DC

## 2016-06-15 NOTE — Assessment & Plan Note (Addendum)
Try increasing IR dose to 20 mg, continue XR dose at 25 mg.  Let me know if not helping. Would consider trying Vyvanse or Mydayis. She can contact me for new Rxs in 3 months, and follow-up in 6 months.

## 2016-06-15 NOTE — Assessment & Plan Note (Signed)
Well-controlled.  Continue current regimen. 

## 2016-06-15 NOTE — Patient Instructions (Signed)
     IF you received an x-ray today, you will receive an invoice from [facility]. Please contact [facility] at [phone] with questions or concerns regarding your invoice.   IF you received labwork today, you will receive an invoice from [facility]. Please contact [facility] at [phone] with questions or concerns regarding your invoice.   Our billing staff will not be able to assist you with questions regarding bills from these companies.  You will be contacted with the lab results as soon as they are available. The fastest way to get your results is to activate your My Chart account. Instructions are located on the last page of this paperwork. If you have not heard from us regarding the results in 2 weeks, please contact this office.     

## 2016-06-15 NOTE — Progress Notes (Signed)
Patient ID: Sherry Estrada, female    DOB: 03/04/1990, 26 y.o.   MRN: [id number]  PCP: Porfirio Oar, PA-C  Chief Complaint  Patient presents with  . Medication Refill    adderall xr and adderall and advair and singulair    Subjective:   Presents for medication refills.  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 The addition of 10 mg of Adderall (IR) has not helped. 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 "After lunch, my brain can't keep up."  Very rarely needs albuterol. Tolerates montelukast and Advair without adverse effects.    Review of Systems As above. No chest pain, SOB, HA, dizziness, vision change, N/V, diarrhea, constipation, dysuria, urinary urgency or frequency, myalgias, arthralgias or rash.     Patient Active Problem List   Diagnosis Date Noted  . 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 GERD (gastroesophageal reflux disease) 06/26/2015  . ADD (attention deficit disorder) 11/06/2011  . Allergic rhinitis 09/28/2011  . Asthma 05/02/2011     Prior to Admission medications   Medication Sig Start Date End Date Taking? Authorizing Provider  amphetamine-dextroamphetamine (ADDERALL XR) 25 MG 24 hr capsule Take 1 capsule by mouth every morning. 03/24/16  Yes Elton Catalano, PA-C  amphetamine-dextroamphetamine (ADDERALL XR) 25 MG 24 hr capsule Take 1 capsule by mouth every morning. 03/24/16  Yes Posie Lillibridge, PA-C  amphetamine-dextroamphetamine (ADDERALL XR) 25 MG 24 hr capsule Take 1 capsule by mouth every morning. 03/24/16  Yes Mykelle Cockerell, PA-C  ampicillin (PRINCIPEN) 500 MG capsule Take 500 mg by mouth daily.    Yes [provider]  Fluticasone-Salmeterol (ADVAIR DISKUS) 250-50 MCG/DOSE AEPB INHALE 1 PUFF BY MOUTH EVERY 12 HOURS 06/26/15  Yes Jafari Mckillop, PA-C  montelukast (SINGULAIR) 10 MG tablet TAKE 1 TABLET BY MOUTH EVERY NIGHT AT BEDTIME  06/26/15  Yes Lanah Steines, PA-C  PROVENTIL HFA 108 (90 BASE) MCG/ACT inhaler INHALE 1 PUFF INTO THE LUNGS EVERY 6 HOURS AS NEEDED FOR WHEEZING 06/14/14  Yes Larren Copes, PA-C  tretinoin (RETIN-A) 0.025 % cream APPLY AA OF FACE QPM 05/23/15  Yes [provider]  amphetamine-dextroamphetamine (ADDERALL) 10 MG tablet Take 1 tablet (10 mg total) by mouth daily before breakfast. 02/19/16 03/20/16  Porfirio Oar, PA-C  amphetamine-dextroamphetamine (ADDERALL) 10 MG tablet Take 1 tablet (10 mg total) by mouth daily before breakfast. 01/19/16 02/18/16  Porfirio Oar, PA-C  amphetamine-dextroamphetamine (ADDERALL) 10 MG tablet Take 1 tablet (10 mg total) by mouth daily before breakfast. 03/24/16 04/23/16  Porfirio Oar, PA-C     Allergies  Allergen Reactions  . Peanut-Containing Drug Products Anaphylaxis    Pt states she had anaphylaxis to tree nuts        Objective:  Physical Exam  Constitutional: She is oriented to person, place, and time. She appears well-developed and well-nourished. She is active and cooperative. No distress.  BP 131/84 (BP Location: Right Arm, Patient Position: Sitting, Cuff Size: Normal)   Pulse 84   Temp 98.9 F (37.2 C) (Oral)   Resp 17   Ht 5\' 7"  (1.702 m)   Wt 139 lb (63 kg)   LMP 06/14/2016   SpO2 98%   BMI 21.77 kg/m   HENT:  Head: Normocephalic and atraumatic.  Right Ear: Hearing normal.  Left Ear: Hearing normal.  Eyes: Conjunctivae are normal. No scleral icterus.  Neck: Normal range of  motion. Neck supple. No thyromegaly present.  Cardiovascular: Normal rate, regular rhythm and normal heart sounds.   Pulses:      Radial pulses are 2+ on the right side, and 2+ on the left side.  Pulmonary/Chest: Effort normal and breath sounds normal.  Lymphadenopathy:       Head (right side): No tonsillar, no preauricular, no posterior auricular and no occipital adenopathy present.       Head (left side): No tonsillar, no preauricular, no posterior  auricular and no occipital adenopathy present.    She has no cervical adenopathy.       Right: No supraclavicular adenopathy present.       Left: No supraclavicular adenopathy present.  Neurological: She is alert and oriented to person, place, and time. No sensory deficit.  Skin: Skin is warm, dry and intact. No rash noted. No cyanosis or erythema. Nails show no clubbing.  Psychiatric: She has a normal mood and affect. Her speech is normal and behavior is normal.           Assessment & Plan:   Problem List Items Addressed This Visit    Asthma - Primary    Well controlled. Continue current regimen.       Relevant Medications   albuterol (PROVENTIL HFA) 108 (90 Base) MCG/ACT inhaler   montelukast (SINGULAIR) 10 MG tablet   Fluticasone-Salmeterol (ADVAIR DISKUS) 250-50 MCG/DOSE AEPB   Allergic rhinitis    Well controlled on montelukast. Continue.      Relevant Medications   montelukast (SINGULAIR) 10 MG tablet   ADD (attention deficit disorder)    Try increasing IR dose to 20 mg, continue XR dose at 25 mg.  Let me know if not helping. Would consider trying Vyvanse or Mydayis.      Relevant Medications   amphetamine-dextroamphetamine (ADDERALL XR) 25 MG 24 hr capsule   amphetamine-dextroamphetamine (ADDERALL XR) 25 MG 24 hr capsule   amphetamine-dextroamphetamine (ADDERALL XR) 25 MG 24 hr capsule   amphetamine-dextroamphetamine (ADDERALL) 20 MG tablet   amphetamine-dextroamphetamine (ADDERALL) 20 MG tablet   amphetamine-dextroamphetamine (ADDERALL) 20 MG tablet    Other Visit Diagnoses    Other seasonal allergic rhinitis       Relevant Medications   montelukast (SINGULAIR) 10 MG tablet       Return in about 6 months (around 12/16/2016) for re-evaluation of ADD and asthma and allergies.   Fernande Brashelle S. Tykesha Konicki, PA-C [facility] at [facility]

## 2016-06-15 NOTE — Assessment & Plan Note (Signed)
Well controlled on montelukast. Continue.

## 2016-06-16 ENCOUNTER — Other Ambulatory Visit: Payer: Self-pay

## 2016-06-16 DIAGNOSIS — J45909 Unspecified asthma, uncomplicated: Secondary | ICD-10-CM

## 2016-06-16 MED ORDER — ALBUTEROL SULFATE HFA 108 (90 BASE) MCG/ACT IN AERS
INHALATION_SPRAY | RESPIRATORY_TRACT | 2 refills | Status: AC
Start: 2016-06-16 — End: ?

## 2016-08-22 ENCOUNTER — Telehealth: Payer: Self-pay | Admitting: Physician Assistant

## 2016-08-22 DIAGNOSIS — F988 Other specified behavioral and emotional disorders with onset usually occurring in childhood and adolescence: Secondary | ICD-10-CM

## 2016-08-22 NOTE — Telephone Encounter (Signed)
adderall refill req.  [phone]

## 2016-08-23 ENCOUNTER — Telehealth: Payer: Self-pay

## 2016-08-23 MED ORDER — AMPHETAMINE-DEXTROAMPHETAMINE 20 MG PO TABS: 20.0000 mg | ORAL_TABLET | Freq: Every day | ORAL | 0 refills | Status: DC

## 2016-08-23 MED ORDER — AMPHETAMINE-DEXTROAMPHETAMINE 20 MG PO TABS: 10.0000 mg | ORAL_TABLET | Freq: Every day | ORAL | 0 refills | Status: DC

## 2016-08-23 MED ORDER — AMPHETAMINE-DEXTROAMPHET ER 25 MG PO CP24: 25.0000 mg | ORAL_CAPSULE | ORAL | 0 refills | Status: DC

## 2016-08-23 NOTE — Telephone Encounter (Signed)
Meds ordered this encounter  Medications  . DISCONTD: amphetamine-dextroamphetamine (ADDERALL) 20 MG tablet    Sig: Take 0.5 tablets (10 mg total) by mouth daily before breakfast.    Dispense:  15 tablet    Refill:  0    May fill 60 days after date on prescription    Order Specific Question:   Supervising Provider    Answer:   Clelia CroftSHAW, EVA N [[id number]]  . DISCONTD: amphetamine-dextroamphetamine (ADDERALL XR) 25 MG 24 hr capsule    Sig: Take 1 capsule by mouth every morning.    Dispense:  30 capsule    Refill:  0    May fill 60 days after date on this prescription    Order Specific Question:   Supervising Provider    Answer:   Clelia CroftSHAW, EVA N [[id number]]  . amphetamine-dextroamphetamine (ADDERALL) 20 MG tablet    Sig: Take 1 tablet (20 mg total) by mouth daily before breakfast.    Dispense:  30 tablet    Refill:  0    May fill 30 days after date on prescription    Order Specific Question:   Supervising Provider    Answer:   Clelia CroftSHAW, EVA N [[id number]]  . amphetamine-dextroamphetamine (ADDERALL XR) 25 MG 24 hr capsule    Sig: Take 1 capsule by mouth every morning.    Dispense:  30 capsule    Refill:  0    May fill 30 days after date on this prescription    Order Specific Question:   Supervising Provider    Answer:   Clelia CroftSHAW, EVA N [[id number]]  . amphetamine-dextroamphetamine (ADDERALL) 20 MG tablet    Sig: Take 1 tablet (20 mg total) by mouth daily before breakfast.    Dispense:  30 tablet    Refill:  0    Order Specific Question:   Supervising Provider    Answer:   SHAW, EVA N [[id number]]  . amphetamine-dextroamphetamine (ADDERALL XR) 25 MG 24 hr capsule    Sig: Take 1 capsule by mouth every morning.    Dispense:  30 capsule    Refill:  0    Order Specific Question:   Supervising Provider    Answer:   SHAW, EVA N [[id number]]  . amphetamine-dextroamphetamine (ADDERALL) 20 MG tablet    Sig: Take 1 tablet (20 mg total) by mouth daily before breakfast.    Dispense:  30 tablet    Refill:  0    May fill 60 days after  date on prescription    Order Specific Question:   Supervising Provider    Answer:   Clelia CroftSHAW, EVA N [[id number]]  . amphetamine-dextroamphetamine (ADDERALL XR) 25 MG 24 hr capsule    Sig: Take 1 capsule by mouth every morning.    Dispense:  30 capsule    Refill:  0    May fill 60 days after date on this prescription    Order Specific Question:   Supervising Provider    Answer:   Clelia CroftSHAW, EVA N [[id number]]

## 2016-08-23 NOTE — Telephone Encounter (Signed)
Called Pt and left a message advising the pt she has Rx ready for pickup at [facility] [address].

## 2016-11-23 ENCOUNTER — Ambulatory Visit (INDEPENDENT_AMBULATORY_CARE_PROVIDER_SITE_OTHER): Payer: BC Managed Care – PPO | Admitting: Physician Assistant

## 2016-11-23 ENCOUNTER — Encounter: Payer: Self-pay | Admitting: Physician Assistant

## 2016-11-23 VITALS — BP 112/80 | HR 88 | Temp 98.2°F | Resp 18 | Ht 67.0 in | Wt 137.0 lb

## 2016-11-23 DIAGNOSIS — F988 Other specified behavioral and emotional disorders with onset usually occurring in childhood and adolescence: Secondary | ICD-10-CM | POA: Diagnosis not present

## 2016-11-23 DIAGNOSIS — J45909 Unspecified asthma, uncomplicated: Secondary | ICD-10-CM

## 2016-11-23 DIAGNOSIS — K219 Gastro-esophageal reflux disease without esophagitis: Secondary | ICD-10-CM

## 2016-11-23 DIAGNOSIS — E876 Hypokalemia: Secondary | ICD-10-CM | POA: Diagnosis not present

## 2016-11-23 DIAGNOSIS — J309 Allergic rhinitis, unspecified: Secondary | ICD-10-CM | POA: Diagnosis not present

## 2016-11-23 MED ORDER — AMPHETAMINE-DEXTROAMPHETAMINE 20 MG PO TABS: 20.0000 mg | ORAL_TABLET | Freq: Every day | ORAL | 0 refills | Status: DC

## 2016-11-23 MED ORDER — AMPHETAMINE-DEXTROAMPHET ER 25 MG PO CP24: 25.0000 mg | ORAL_CAPSULE | ORAL | 0 refills | Status: DC

## 2016-11-23 NOTE — Patient Instructions (Signed)
     IF you received an x-ray today, you will receive an invoice from [facility]. Please contact [facility] at [phone] with questions or concerns regarding your invoice.   IF you received labwork today, you will receive an invoice from [facility]. Please contact [facility] at [phone] with questions or concerns regarding your invoice.   Our billing staff will not be able to assist you with questions regarding bills from these companies.  You will be contacted with the lab results as soon as they are available. The fastest way to get your results is to activate your My Chart account. Instructions are located on the last page of this paperwork. If you have not heard from us regarding the results in 2 weeks, please contact this office.     

## 2016-11-23 NOTE — Progress Notes (Signed)
Subjective:    Patient ID: Sherry Estrada, female    DOB: May 24, 1990, 26 y.o.   MRN: [id number]  HPI  Sherry Estrada is a 26 year old Caucasian female with a past medical history significant for ADD, asthma, and allergic rhinitis and a history of hypokalemia who presents today for an ADD, asthma, and hypokalemia follow-up.   ADD: Sherry Estrada states she is compliant with all of her medications and tolerating them well. She reports she currently takes Adderall XR 25mg  every morning and then takes Adderall 20mg  at lunch. Sherry Estrada feels like the Adderall 20mg  at lunch is the perfect dose. Sherry Estrada tries to eat breakfast at 10am. She will eat a smoothie, fruit, granola bar, or yogurt. She then is hungry at lunch and has a salad for lunch with apples and peanut butter. Sherry Estrada states she gets full and sometimes will not eat all of her lunch. Sherry Estrada reports she is always hungry by 5:30pm every day even if she snacks. She states she feels weak and grumpy and feels like she is crashing. Sherry Estrada states she does not have any headaches, tics or mood changes.  Hypokalemia:  1 year ago at 2 am randomly woke up and entire body was tense Worse pain she every felt in epigastric region and upper back  Throwing up from pain  Sweating  Went to the ED year ago  Potassium was low  Given 4 bags of potassium Ultrasound looked ok   GI did a barium swallow test because esophagus would close with steak and carrots would close and get tight  Barium swallow was ok  2 scenarios that were similar to the night   Cramps in hips Taking potassium daily  Being very conscientious of her potassium  2 bananas daily   Asthma: Sherry Estrada denies any recent hospitalizations for asthma exacerbations. She used her Albuterol inhaler 1 month ago after being at a friend's house who had a lot of cats. Sherry Estrada denies use of oral steroids in the past year. She also denies asthma disturbing her sleep.   Medications: Prior to  Admission medications   Medication Sig Start Date End Date Taking? Authorizing Provider  albuterol (PROVENTIL HFA) 108 (90 Base) MCG/ACT inhaler INHALE 1 PUFF INTO THE LUNGS EVERY 6 HOURS AS NEEDED FOR WHEEZING 06/16/16  Yes Jeffery, Chelle, PA-C  amphetamine-dextroamphetamine (ADDERALL XR) 25 MG 24 hr capsule Take 1 capsule by mouth every morning. 08/23/16  Yes Jeffery, Chelle, PA-C  amphetamine-dextroamphetamine (ADDERALL XR) 25 MG 24 hr capsule Take 1 capsule by mouth every morning. 08/23/16  Yes Jeffery, Chelle, PA-C  amphetamine-dextroamphetamine (ADDERALL XR) 25 MG 24 hr capsule Take 1 capsule by mouth every morning. 08/23/16  Yes Jeffery, Chelle, PA-C  ampicillin (PRINCIPEN) 500 MG capsule Take 500 mg by mouth daily.    Yes [provider]  Fluticasone-Salmeterol (ADVAIR DISKUS) 250-50 MCG/DOSE AEPB INHALE 1 PUFF BY MOUTH EVERY 12 HOURS 06/15/16  Yes Jeffery, Chelle, PA-C  montelukast (SINGULAIR) 10 MG tablet TAKE 1 TABLET BY MOUTH EVERY NIGHT AT BEDTIME 06/15/16  Yes Jeffery, Chelle, PA-C  tretinoin (RETIN-A) 0.025 % cream APPLY AA OF FACE QPM 05/23/15  Yes [provider]  amphetamine-dextroamphetamine (ADDERALL) 20 MG tablet Take 1 tablet (20 mg total) by mouth daily before breakfast. 08/23/16 09/22/16  Porfirio OarJeffery, Chelle, PA-C  amphetamine-dextroamphetamine (ADDERALL) 20 MG tablet Take 1 tablet (20 mg total) by mouth daily before breakfast. 08/23/16 09/22/16  Porfirio OarJeffery, Chelle, PA-C  amphetamine-dextroamphetamine (ADDERALL) 20  MG tablet Take 1 tablet (20 mg total) by mouth daily before breakfast. 08/23/16 09/22/16  Porfirio Oar, PA-C   Allergies: Allergies  Allergen Reactions  . Peanut-Containing Drug Products Anaphylaxis    Pt states she had anaphylaxis to tree nuts    Chronic Medical Conditions:  Patient Active Problem List   Diagnosis Date Noted  . 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 GERD (gastroesophageal reflux disease) 06/26/2015  . ADD (attention deficit disorder) 11/06/2011    . Allergic rhinitis 09/28/2011  . Asthma 05/02/2011   Review of Systems     Objective:   Physical Exam  Constitutional: She appears well-developed and well-nourished.  BP 112/80 (BP Location: Left Arm, Patient Position: Sitting, Cuff Size: Normal)   Pulse 88   Temp 98.2 F (36.8 C) (Oral)   Resp 18   Ht 5\' 7"  (1.702 m)   Wt 137 lb (62.1 kg)   LMP 11/20/2016   SpO2 97%   BMI 21.46 kg/m    HENT:  Head: Normocephalic and atraumatic.  Neck: Normal range of motion. Neck supple. No thyromegaly present.  Cardiovascular: Normal rate, regular rhythm and normal heart sounds.   Pulmonary/Chest: Effort normal and breath sounds normal.  Abdominal: Soft. Bowel sounds are normal. She exhibits no distension and no mass. There is no tenderness.  Lymphadenopathy:    She has no cervical adenopathy.  Neurological: She is alert.  Reflex Scores:      Tricep reflexes are 2+ on the right side and 2+ on the left side.      Bicep reflexes are 2+ on the right side and 2+ on the left side.      Brachioradialis reflexes are 2+ on the right side and 2+ on the left side.      Patellar reflexes are 2+ on the right side and 2+ on the left side. Skin: Skin is warm and dry.      Assessment & Plan:    1. Attention deficit disorder (ADD) without hyperactivity - Continue Adderall XR 25mg  at breakfast daily and Adderall 20mg  as needed at lunchtime.  - Refills provided for 3 months of Adderall XR 25mg  and Adderall 20mg . - Patient to call in 3 months and I will send 3 more months of refills for Adderall 20mg  and Adderall XR 25mg  daily. Patient to return to clinic in 6 months for ADD follow-up.   2. Uncomplicated asthma, unspecified asthma severity, unspecified whether persistent - Continue Albuterol inhaler as needed every 4 hours for cough or shortness of breath.  3. Hypokalemia - CMP obtained today in clinic - EKG obtained today in clinic showed normal sinus rhythm.  - Informed patient that we will  contact her with potassium results.  - Instructed patient to return to clinic with return of epigastric or back pain    Doye Montilla, PA-S

## 2016-11-23 NOTE — Progress Notes (Signed)
Patient ID: Sherry DebarBrooke R Estrada, female    DOB: 07-05-1990, 26 y.o.   MRN: [id number]  PCP: Porfirio OarJeffery, Joseph Bias, PA-C  Chief Complaint  Patient presents with  . Medication Refill    Adderall 20 and 25 MG, Advair 250-50  . Hypokalemia    Pt states she would like to discuss her potassium because of hospitalization last year. Pt states she had two episodes of the symptoms she experienced.    Subjective:   Presents for medication refills, and also follow-up of hypokalemia.  Adderall XR 25 mg QAM and then Adderall 20 mg at lunch is working really well. She notes that she feels really hungry in the afternoons, gets tired and grumpy by 5:30 pm, even if she snacks. No associated headaches. No near syncope.  She was told her potassium was low during an ED visit 11/06/2015. She had awoken in the middle of the night with epigastric abdominal pain radiating to the back, unrelieved by ranitidine. She reported sweating, chest tightness and vomiting. CMET was entirely abnormal, but potassium was particularly notable at 2.6. K was replaced orally (by notes, though she relates that she received it IV). She was discharged to home improved and has not had a recurrence, nor has the potassium level been rechecked.   Review of Systems As above.    Patient Active Problem List   Diagnosis Date Noted  . 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 GERD (gastroesophageal reflux disease) 06/26/2015  . ADD (attention deficit disorder) 11/06/2011  . Allergic rhinitis 09/28/2011  . Asthma 05/02/2011     Prior to Admission medications   Medication Sig Start Date End Date Taking? Authorizing Provider  albuterol (PROVENTIL HFA) 108 (90 Base) MCG/ACT inhaler INHALE 1 PUFF INTO THE LUNGS EVERY 6 HOURS AS NEEDED FOR WHEEZING 06/16/16  Yes Alyssandra Hulsebus, PA-C  amphetamine-dextroamphetamine (ADDERALL XR) 25 MG 24 hr capsule Take 1 capsule by mouth every morning. 08/23/16  Yes Mclean Moya, PA-C  amphetamine-dextroamphetamine  (ADDERALL XR) 25 MG 24 hr capsule Take 1 capsule by mouth every morning. 08/23/16  Yes Madisen Ludvigsen, PA-C  amphetamine-dextroamphetamine (ADDERALL XR) 25 MG 24 hr capsule Take 1 capsule by mouth every morning. 08/23/16  Yes Creta Dorame, PA-C  ampicillin (PRINCIPEN) 500 MG capsule Take 500 mg by mouth daily.    Yes [provider]  Fluticasone-Salmeterol (ADVAIR DISKUS) 250-50 MCG/DOSE AEPB INHALE 1 PUFF BY MOUTH EVERY 12 HOURS 06/15/16  Yes Markey Deady, PA-C  montelukast (SINGULAIR) 10 MG tablet TAKE 1 TABLET BY MOUTH EVERY NIGHT AT BEDTIME 06/15/16  Yes Aireona Torelli, PA-C  tretinoin (RETIN-A) 0.025 % cream APPLY AA OF FACE QPM 05/23/15  Yes [provider]  amphetamine-dextroamphetamine (ADDERALL) 20 MG tablet Take 1 tablet (20 mg total) by mouth daily before breakfast. 08/23/16 09/22/16  Porfirio OarJeffery, Ryelee Albee, PA-C  amphetamine-dextroamphetamine (ADDERALL) 20 MG tablet Take 1 tablet (20 mg total) by mouth daily before breakfast. 08/23/16 09/22/16  Porfirio OarJeffery, Tomeca Helm, PA-C  amphetamine-dextroamphetamine (ADDERALL) 20 MG tablet Take 1 tablet (20 mg total) by mouth daily before breakfast. 08/23/16 09/22/16  Porfirio OarJeffery, Savonna Birchmeier, PA-C     Allergies  Allergen Reactions  . Peanut-Containing Drug Products Anaphylaxis    Pt states she had anaphylaxis to tree nuts        Objective:  Physical Exam  Constitutional: She is oriented to person, place, and time. She appears well-developed and well-nourished. She is active and cooperative. No distress.  BP 112/80 (BP Location: Left Arm, Patient Position: Sitting, Cuff Size: Normal)  Pulse 88   Temp 98.2 F (36.8 C) (Oral)   Resp 18   Ht 5\' 7"  (1.702 m)   Wt 137 lb (62.1 kg)   LMP 11/20/2016   SpO2 97%   BMI 21.46 kg/m   HENT:  Head: Normocephalic and atraumatic.  Right Ear: Hearing normal.  Left Ear: Hearing normal.  Eyes: Conjunctivae are normal. No scleral icterus.  Neck: Normal range of motion. Neck supple. No thyromegaly present.    Cardiovascular: Normal rate, regular rhythm and normal heart sounds.  Pulses:      Radial pulses are 2+ on the right side, and 2+ on the left side.  Pulmonary/Chest: Effort normal and breath sounds normal.  Lymphadenopathy:       Head (right side): No tonsillar, no preauricular, no posterior auricular and no occipital adenopathy present.       Head (left side): No tonsillar, no preauricular, no posterior auricular and no occipital adenopathy present.    She has no cervical adenopathy.       Right: No supraclavicular adenopathy present.       Left: No supraclavicular adenopathy present.  Neurological: She is alert and oriented to person, place, and time. No sensory deficit.  Skin: Skin is warm, dry and intact. No rash noted. No cyanosis or erythema. Nails show no clubbing.  Psychiatric: She has a normal mood and affect. Her speech is normal and behavior is normal. Judgment and thought content normal. Cognition and memory are normal.    EKG reviewed with Dr. Leretha Pol. NSR. Rate 81. PR 138. QT 360. No previous tracing for comparison.   Wt Readings from Last 3 Encounters:  11/23/16 137 lb (62.1 kg)  06/15/16 139 lb (63 kg)  12/19/15 137 lb (62.1 kg)       Assessment & Plan:   Problem List Items Addressed This Visit    Asthma - Primary    Controlled. No changes.      Allergic rhinitis    Controlled. No changes.      ADD (attention deficit disorder)    Well controlled.      Relevant Medications   amphetamine-dextroamphetamine (ADDERALL) 20 MG tablet   amphetamine-dextroamphetamine (ADDERALL XR) 25 MG 24 hr capsule   amphetamine-dextroamphetamine (ADDERALL) 20 MG tablet   amphetamine-dextroamphetamine (ADDERALL XR) 25 MG 24 hr capsule   amphetamine-dextroamphetamine (ADDERALL) 20 MG tablet   amphetamine-dextroamphetamine (ADDERALL XR) 25 MG 24 hr capsule   GERD (gastroesophageal reflux disease)    Stable. COntrolled.       Other Visit Diagnoses    Hypokalemia        Await labs. ANticipate normal results.   Relevant Orders   Comprehensive metabolic panel (Completed)   EKG 12-Lead (Completed)       Return in about 6 months (around 05/24/2017) for re-evaluation of ADD and asthma.   Fernande Bras, PA-C Primary Care at [facility]

## 2016-11-24 LAB — COMPREHENSIVE METABOLIC PANEL
A/G RATIO: 1.6 (ref 1.2–2.2)
ALT: 8 IU/L (ref 0–32)
AST: 17 IU/L (ref 0–40)
Albumin: 4.5 g/dL (ref 3.5–5.5)
Alkaline Phosphatase: 69 IU/L (ref 39–117)
BUN / CREAT RATIO: 9 (ref 9–23)
BUN: 7 mg/dL (ref 6–20)
Bilirubin Total: 0.3 mg/dL (ref 0.0–1.2)
CALCIUM: 9.5 mg/dL (ref 8.7–10.2)
CO2: 23 mmol/L (ref 20–29)
Chloride: 100 mmol/L (ref 96–106)
Creatinine, Ser: 0.8 mg/dL (ref 0.57–1.00)
GFR calc Af Amer: 116 mL/min/{1.73_m2} (ref >59)
GFR, EST NON AFRICAN AMERICAN: 101 mL/min/{1.73_m2} (ref >59)
GLOBULIN, TOTAL: 2.8 g/dL (ref 1.5–4.5)
Glucose: 95 mg/dL (ref 65–99)
POTASSIUM: 4.7 mmol/L (ref 3.5–5.2)
Sodium: 139 mmol/L (ref 134–144)
TOTAL PROTEIN: 7.3 g/dL (ref 6.0–8.5)

## 2016-12-04 NOTE — Assessment & Plan Note (Signed)
Controlled. No changes. 

## 2016-12-04 NOTE — Assessment & Plan Note (Signed)
Well controlled 

## 2016-12-04 NOTE — Assessment & Plan Note (Signed)
Stable. COntrolled. 

## 2017-01-11 NOTE — Telephone Encounter (Signed)
Error-Close Encounter 

## 2017-03-02 ENCOUNTER — Telehealth: Payer: Self-pay | Admitting: Physician Assistant

## 2017-03-02 DIAGNOSIS — F988 Other specified behavioral and emotional disorders with onset usually occurring in childhood and adolescence: Secondary | ICD-10-CM

## 2017-03-02 MED ORDER — AMPHETAMINE-DEXTROAMPHETAMINE 20 MG PO TABS: 20.0000 mg | ORAL_TABLET | Freq: Every day | ORAL | 0 refills | Status: DC

## 2017-03-02 MED ORDER — AMPHETAMINE-DEXTROAMPHET ER 25 MG PO CP24: 25.0000 mg | ORAL_CAPSULE | ORAL | 0 refills | Status: DC

## 2017-03-02 NOTE — Telephone Encounter (Signed)
Refill request for provider review:  Adderall XR 25 mg Adderall 20 mg  LOV: 11/23/2016  Pharmacy: verified same

## 2017-03-02 NOTE — Telephone Encounter (Signed)
[facility]

## 2017-03-02 NOTE — Telephone Encounter (Signed)
Copied from CRM [phone] [phone]. Topic: Quick Communication - Rx Refill/Question >> Mar 02, 2017 10:07 AM Jolayne Hainesaylor, Brittany L wrote: Medication:  amphetamine-dextroamphetamine (ADDERALL XR) 25 MG 24 hr capsule amphetamine-dextroamphetamine (ADDERALL) 20 MG tablet (Expired)   Has the patient contacted their pharmacy? yes  (Agent: If no, request that the patient contact the pharmacy for the refill.)   Preferred Pharmacy (with phone number or street name):  [facility] [id number] - [place], [place] - [address]  Agent: Please be advised that RX refills may take up to 3 business days. We ask that you follow-up with your pharmacy.

## 2017-03-02 NOTE — Telephone Encounter (Signed)
Rx sent electronically.  Meds ordered this encounter  Medications  . amphetamine-dextroamphetamine (ADDERALL) 20 MG tablet    Sig: Take 1 tablet (20 mg total) by mouth daily before breakfast.    Dispense:  30 tablet    Refill:  0    May fill 30 days after date on prescription    Order Specific Question:   Supervising Provider    Answer:   Clelia CroftSHAW, EVA N [[id number]]  . amphetamine-dextroamphetamine (ADDERALL XR) 25 MG 24 hr capsule    Sig: Take 1 capsule by mouth every morning.    Dispense:  30 capsule    Refill:  0    May fill 60 days after date on this prescription    Order Specific Question:   Supervising Provider    Answer:   Clelia CroftSHAW, EVA N [[id number]]  . amphetamine-dextroamphetamine (ADDERALL XR) 25 MG 24 hr capsule    Sig: Take 1 capsule by mouth every morning.    Dispense:  30 capsule    Refill:  0    Order Specific Question:   Supervising Provider    Answer:   SHAW, EVA N [[id number]]  . amphetamine-dextroamphetamine (ADDERALL XR) 25 MG 24 hr capsule    Sig: Take 1 capsule by mouth every morning.    Dispense:  30 capsule    Refill:  0    May fill 30 days after date on this prescription    Order Specific Question:   Supervising Provider    Answer:   Clelia CroftSHAW, EVA N [[id number]]  . amphetamine-dextroamphetamine (ADDERALL) 20 MG tablet    Sig: Take 1 tablet (20 mg total) by mouth daily before breakfast.    Dispense:  30 tablet    Refill:  0    May fill 60 days after date on prescription    Order Specific Question:   Supervising Provider    Answer:   Clelia CroftSHAW, EVA N [[id number]]  . amphetamine-dextroamphetamine (ADDERALL) 20 MG tablet    Sig: Take 1 tablet (20 mg total) by mouth daily before breakfast.    Dispense:  30 tablet    Refill:  0    Order Specific Question:   Supervising Provider    Answer:   Clelia CroftSHAW, EVA N [[id number]]

## 2017-03-29 ENCOUNTER — Telehealth: Payer: Self-pay | Admitting: Physician Assistant

## 2017-03-29 NOTE — Telephone Encounter (Signed)
Copied from CRM [id number]. Topic: Quick Communication - See Telephone Encounter >> Mar 29, 2017  4:21 PM Terisa Starraylor, Brittany L wrote: CRM for notification. See Telephone encounter for:   03/29/17.  amphetamine-dextroamphetamine (ADDERALL XR) 25 MG 24 hr capsule amphetamine-dextroamphetamine (ADDERALL) 20 MG tablet ONLY needs 2 months worth bc she just got a one month fill.   [facility] [phone] - [place], [place] - [address]

## 2017-03-30 NOTE — Telephone Encounter (Signed)
Attempted to call pt. re: her request for 2 months worth of Adderall XR 25 mg, and Adderall 20 mg.  Left voice message that the pt. should check with the pharmacy, as there were 3 prescriptions, of each of the above medications, ordered on 03/02/17, and sent to the pharmacy.

## 2017-05-04 ENCOUNTER — Encounter: Payer: Self-pay | Admitting: Physician Assistant

## 2017-05-30 ENCOUNTER — Ambulatory Visit: Payer: BC Managed Care – PPO | Admitting: Physician Assistant

## 2017-05-30 ENCOUNTER — Other Ambulatory Visit: Payer: Self-pay

## 2017-05-30 ENCOUNTER — Encounter: Payer: Self-pay | Admitting: Physician Assistant

## 2017-05-30 VITALS — BP 104/66 | HR 106 | Temp 98.9°F | Resp 16 | Ht 67.0 in | Wt 133.6 lb

## 2017-05-30 DIAGNOSIS — F988 Other specified behavioral and emotional disorders with onset usually occurring in childhood and adolescence: Secondary | ICD-10-CM | POA: Diagnosis not present

## 2017-05-30 DIAGNOSIS — R1310 Dysphagia, unspecified: Secondary | ICD-10-CM | POA: Diagnosis not present

## 2017-05-30 DIAGNOSIS — K219 Gastro-esophageal reflux disease without esophagitis: Secondary | ICD-10-CM | POA: Diagnosis not present

## 2017-05-30 MED ORDER — PANTOPRAZOLE SODIUM 40 MG PO TBEC: 40.0000 mg | DELAYED_RELEASE_TABLET | Freq: Every day | ORAL | 3 refills | Status: DC

## 2017-05-30 MED ORDER — AMPHETAMINE-DEXTROAMPHETAMINE 20 MG PO TABS: 20.0000 mg | ORAL_TABLET | Freq: Every day | ORAL | 0 refills | Status: DC

## 2017-05-30 MED ORDER — AMPHETAMINE-DEXTROAMPHET ER 25 MG PO CP24: 25.0000 mg | ORAL_CAPSULE | ORAL | 0 refills | Status: AC

## 2017-05-30 MED ORDER — AMPHETAMINE-DEXTROAMPHET ER 25 MG PO CP24: 25.0000 mg | ORAL_CAPSULE | ORAL | 0 refills | Status: DC

## 2017-05-30 MED ORDER — SUCRALFATE 1 G PO TABS: 1.0000 g | ORAL_TABLET | Freq: Three times a day (TID) | ORAL | 1 refills | Status: DC

## 2017-05-30 NOTE — Patient Instructions (Addendum)
1. Avoid steak 2. Start the Carafate and pantoprazole. 3. Schedule with GI ([facility], Dr. Dulce Sellar) when they call.      IF you received an x-ray today, you will receive an invoice from [facility]. Please contact [facility] at [phone] with questions or concerns regarding your invoice.   IF you received labwork today, you will receive an invoice from [facility]. Please contact [facility] at [phone] with questions or concerns regarding your invoice.   Our billing staff will not be able to assist you with questions regarding bills from these companies.  You will be contacted with the lab results as soon as they are available. The fastest way to get your results is to activate your My Chart account. Instructions are located on the last page of this paperwork. If you have not heard from [place] regarding the results in 2 weeks, please contact this office.

## 2017-05-30 NOTE — Progress Notes (Signed)
Patient ID: Sherry Estrada, female    DOB: [birth year], 27 y.o.   MRN: [id number]  PCP: Porfirio Oar, PA-C  Chief Complaint  Patient presents with  . Medication Refill    adderall  . 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  Last year developed some esophageal symptoms. Saw GI, told it was reflux. Symptoms have recurred. Gums, tongue, throat feel swollen. Food feels like it gets stuck, recently steak.  Discomfort for several hours. Feels so irritated that she doesn't want to eat. Burning pain with mandarin orange.  Her brother had to have his esophagus stretched. Her grandmother's esophagus stopped working as she got older.  Her boyfriend is a PA, in orthopedics, and looked in her throat, advised her that it was inflamed, and that she should see her provider. He suggested the possibility of eosinophilic esophagitis.  ADD is well controlled with Adderall XR QAM and immediate release at lunch.  Review of Systems As above.    Patient Active Problem List   Diagnosis Date Noted  . 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 GERD (gastroesophageal reflux disease) 06/26/2015  . ADD (attention deficit disorder) 11/06/2011  . Allergic rhinitis 09/28/2011  . Asthma 05/02/2011     Prior to Admission medications   Medication Sig Start Date End Date Taking? Authorizing Provider  albuterol (PROVENTIL HFA) 108 (90 Base) MCG/ACT inhaler INHALE 1 PUFF INTO THE LUNGS EVERY 6 HOURS AS NEEDED FOR WHEEZING 06/16/16  Yes Maceo Hernan, PA-C  amphetamine-dextroamphetamine (ADDERALL XR) 25 MG 24 hr capsule Take 1 capsule by mouth every morning. 03/02/17  Yes Darcey Demma, PA-C  amphetamine-dextroamphetamine (ADDERALL) 20 MG tablet TK 1 T PO QAM BEFORE BREAKFAST 05/04/17  Yes [provider]  ampicillin (PRINCIPEN) 500 MG capsule Take  500 mg by mouth daily.    Yes [provider]  Fluticasone-Salmeterol (ADVAIR DISKUS) 250-50 MCG/DOSE AEPB INHALE 1 PUFF BY MOUTH EVERY 12 HOURS 06/15/16  Yes Khristina Janota, PA-C  montelukast (SINGULAIR) 10 MG tablet TAKE 1 TABLET BY MOUTH EVERY NIGHT AT BEDTIME 06/15/16  Yes Muzammil Bruins, PA-C  tretinoin (RETIN-A) 0.025 % cream APPLY AA OF FACE QPM 05/23/15  Yes [provider]  amphetamine-dextroamphetamine (ADDERALL XR) 25 MG 24 hr capsule Take 1 capsule by mouth every morning. Patient not taking: Reported on 05/30/2017 03/02/17   Porfirio Oar, PA-C  amphetamine-dextroamphetamine (ADDERALL XR) 25 MG 24 hr capsule Take 1 capsule by mouth every morning. Patient not taking: Reported on 05/30/2017 03/02/17   Porfirio Oar, PA-C  amphetamine-dextroamphetamine (ADDERALL) 20 MG tablet Take 1 tablet (20 mg total) by mouth daily before breakfast. 03/02/17 04/01/17  Porfirio Oar, PA-C  amphetamine-dextroamphetamine (ADDERALL) 20 MG tablet Take 1 tablet (20 mg total) by mouth daily before breakfast. 03/02/17 04/01/17  Porfirio Oar, PA-C  amphetamine-dextroamphetamine (ADDERALL) 20 MG tablet Take 1 tablet (20 mg total) by mouth daily before breakfast. 03/02/17 04/01/17  Porfirio Oar, PA-C     Allergies  Allergen Reactions  . Peanut-Containing Drug Products Anaphylaxis    Pt states she had anaphylaxis to tree nuts        Objective:  Physical Exam  Constitutional: She is oriented to person, place, and time. She appears well-developed and well-nourished. She is active and cooperative. No distress.  BP 104/66   Pulse (!) 106   Temp 98.9 F (37.2 C)  Resp 16   Ht  (1.702 m)   Wt 133 lb 9.6 oz (60.6 kg)   SpO2 98%   BMI 20.92 kg/m   HENT:  Head: Normocephalic and atraumatic.  Right Ear: Hearing and external ear normal.  Left Ear: Hearing and external ear normal.  Nose: Nose normal.  Mouth/Throat: Oropharynx is clear and moist.  Eyes: Pupils are equal, round, and  reactive to light. Conjunctivae and EOM are normal. No scleral icterus.  Neck: Normal range of motion. Neck supple. No thyromegaly present.  Cardiovascular: Normal rate, regular rhythm and normal heart sounds.  Pulses:      Radial pulses are 2+ on the right side, and 2+ on the left side.  Pulmonary/Chest: Effort normal and breath sounds normal.  Lymphadenopathy:       Head (right side): No tonsillar, no preauricular, no posterior auricular and no occipital adenopathy present.       Head (left side): No tonsillar, no preauricular, no posterior auricular and no occipital adenopathy present.    She has no cervical adenopathy.       Right: No supraclavicular adenopathy present.       Left: No supraclavicular adenopathy present.  Neurological: She is alert and oriented to person, place, and time. No sensory deficit.  Skin: Skin is warm, dry and intact. No rash noted. No cyanosis or erythema. Nails show no clubbing.  Psychiatric: She has a normal mood and affect. Her speech is normal and behavior is normal.           Assessment & Plan:   Problem List Items Addressed This Visit    GERD (gastroesophageal reflux disease)   Relevant Medications   sucralfate (CARAFATE) 1 g tablet   pantoprazole (PROTONIX) 40 MG tablet   ADD (attention deficit disorder)    Well-controlled.  Continue current regimen.      Relevant Medications   amphetamine-dextroamphetamine (ADDERALL XR) 25 MG 24 hr capsule   amphetamine-dextroamphetamine (ADDERALL XR) 25 MG 24 hr capsule   amphetamine-dextroamphetamine (ADDERALL XR) 25 MG 24 hr capsule   amphetamine-dextroamphetamine (ADDERALL) 20 MG tablet   amphetamine-dextroamphetamine (ADDERALL) 20 MG tablet   amphetamine-dextroamphetamine (ADDERALL) 20 MG tablet    Other Visit Diagnoses    Dysphagia, unspecified type    -  Primary   Concerning.  Add sucralfate to daily pantoprazole.  Follow-up with GI.   Relevant Medications   sucralfate (CARAFATE) 1 g tablet    pantoprazole (PROTONIX) 40 MG tablet   Other Relevant Orders   Ambulatory referral to Gastroenterology       Return in about 3 months (around 08/29/2017), or if symptoms worsen or fail to improve, for re-evalaution of ADD.   Fernande Bras, PA-C Primary Care at [facility]

## 2017-06-08 ENCOUNTER — Ambulatory Visit: Payer: BC Managed Care – PPO | Admitting: Physician Assistant

## 2017-06-18 ENCOUNTER — Other Ambulatory Visit: Payer: Self-pay | Admitting: Physician Assistant

## 2017-06-18 DIAGNOSIS — J4530 Mild persistent asthma, uncomplicated: Secondary | ICD-10-CM

## 2017-06-22 ENCOUNTER — Other Ambulatory Visit: Payer: Self-pay | Admitting: Physician Assistant

## 2017-06-22 DIAGNOSIS — J45909 Unspecified asthma, uncomplicated: Secondary | ICD-10-CM

## 2017-06-23 ENCOUNTER — Other Ambulatory Visit: Payer: Self-pay | Admitting: Physician Assistant

## 2017-06-23 DIAGNOSIS — J45909 Unspecified asthma, uncomplicated: Secondary | ICD-10-CM

## 2017-06-25 NOTE — Assessment & Plan Note (Signed)
Well-controlled.  Continue current regimen. 

## 2017-06-29 ENCOUNTER — Other Ambulatory Visit: Payer: Self-pay

## 2017-06-29 ENCOUNTER — Encounter: Payer: Self-pay | Admitting: Physician Assistant

## 2017-06-29 DIAGNOSIS — J45909 Unspecified asthma, uncomplicated: Secondary | ICD-10-CM

## 2017-06-29 MED ORDER — FLUTICASONE-SALMETEROL 250-50 MCG/DOSE IN AEPB: INHALATION_SPRAY | RESPIRATORY_TRACT | 1 refills | Status: AC

## 2017-07-20 ENCOUNTER — Telehealth: Payer: Self-pay | Admitting: Physician Assistant

## 2017-07-20 NOTE — Telephone Encounter (Signed)
[facility] has left multiple messages and have been unable to contact pt to schedule an appt.  If pt wishes to schedule, please have them call the office to set that up at [phone]

## 2018-01-31 NOTE — L&D Delivery Note (Signed)
Delivery Note Pt pushed for about 27mins and at 5:33 PM a viable female was delivered via Vaginal, Spontaneous (Presentation:ROA ;  ).  APGAR: 7, 8; weight 9 lb 14.2 oz (4485 g).   Placenta status:delivered, intact, schultz , .  Cord: 3vc with the following complications: none.  Cord pH: n/a  Anesthesia:  Epidural Episiotomy: None Lacerations: 1st degree;Labial Suture Repair: 3.0 vicryl Est. Blood Loss (mL): 903  Mom to postpartum.  Baby to Couplet care / Skin to Skin Desires circumcision in hospital .  [place] 09/10/2018, 6:07 PM

## 2018-02-14 LAB — OB RESULTS CONSOLE GC/CHLAMYDIA
Chlamydia: NEGATIVE
Gonorrhea: NEGATIVE

## 2018-02-14 LAB — OB RESULTS CONSOLE RUBELLA ANTIBODY, IGM: Rubella: IMMUNE

## 2018-02-14 LAB — OB RESULTS CONSOLE ABO/RH: RH Type: POSITIVE

## 2018-02-14 LAB — OB RESULTS CONSOLE HIV ANTIBODY (ROUTINE TESTING): HIV: NONREACTIVE

## 2018-02-14 LAB — OB RESULTS CONSOLE ANTIBODY SCREEN: Antibody Screen: NEGATIVE

## 2018-02-14 LAB — OB RESULTS CONSOLE RPR: RPR: NONREACTIVE

## 2018-02-14 LAB — OB RESULTS CONSOLE HEPATITIS B SURFACE ANTIGEN: Hepatitis B Surface Ag: NEGATIVE

## 2018-04-19 IMAGING — US US ABDOMEN LIMITED
1 series · 14 of 25 positions shown · non-contrast
Comparison: None.

CLINICAL DATA: Epigastric and abdominal pain for 4 hours, nausea
and vomiting.

EXAM:
US ABDOMEN LIMITED - RIGHT UPPER QUADRANT

[Series 1: us abdomen limited · 0.20mm/px · 14 of 106 slices shown]
[im 1/106]
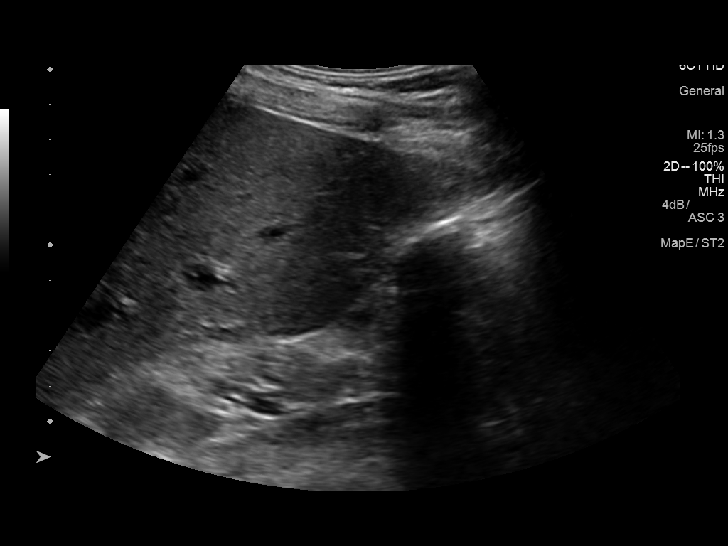
[im 9/106]
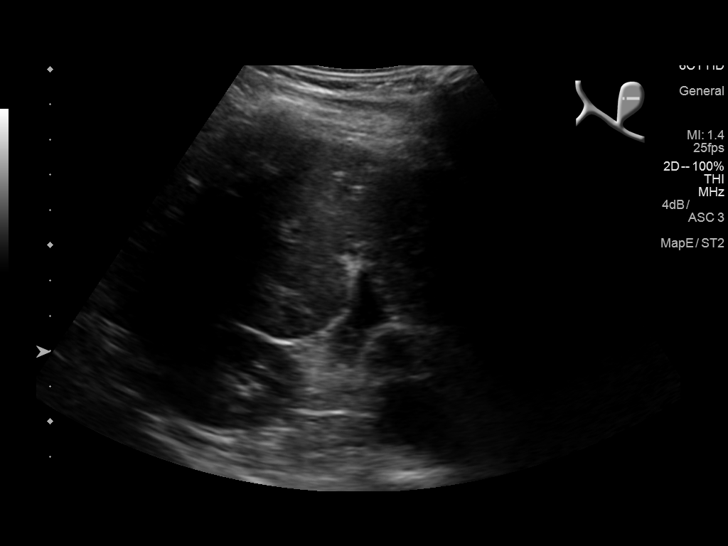
[im 18/106]
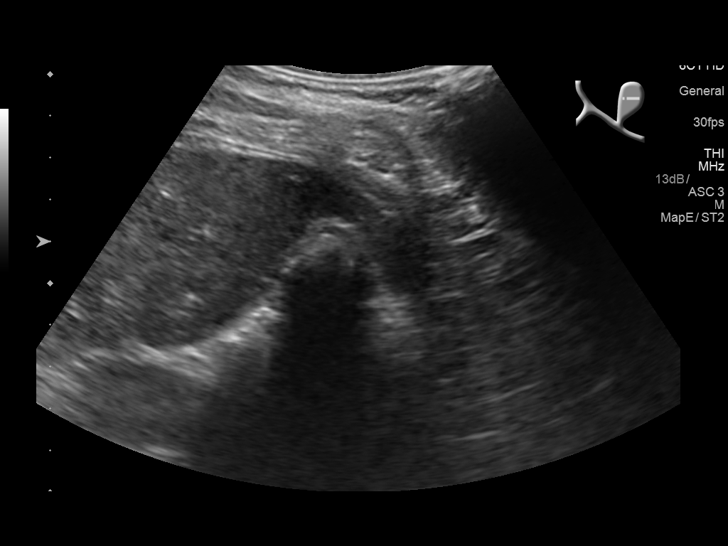
[im 27/106]
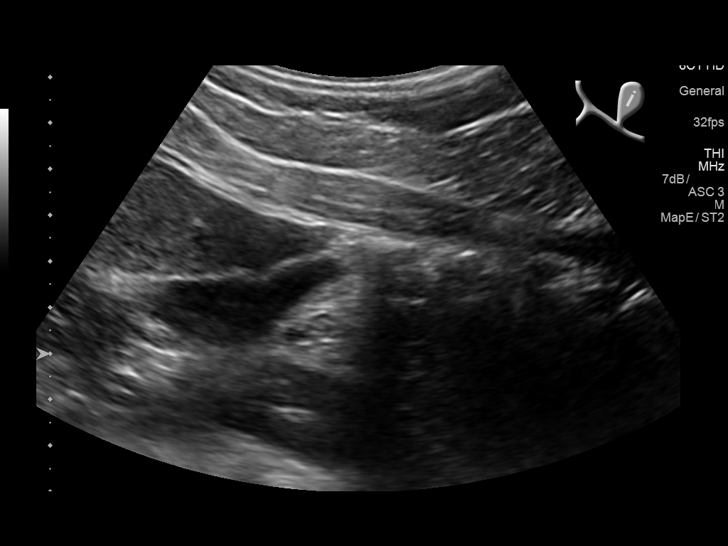
[im 36/106]
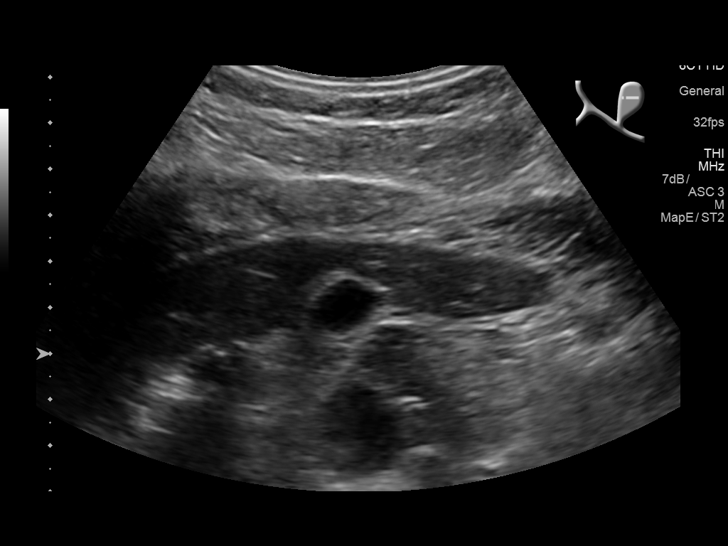
[im 40/106]
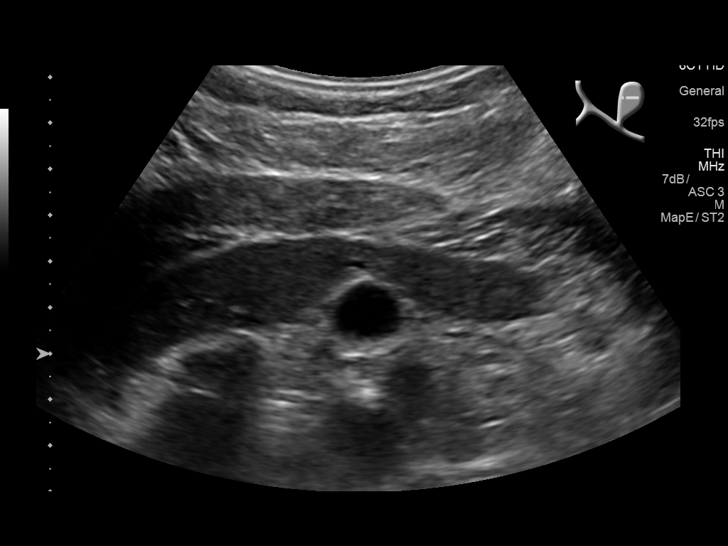
[im 49/106]
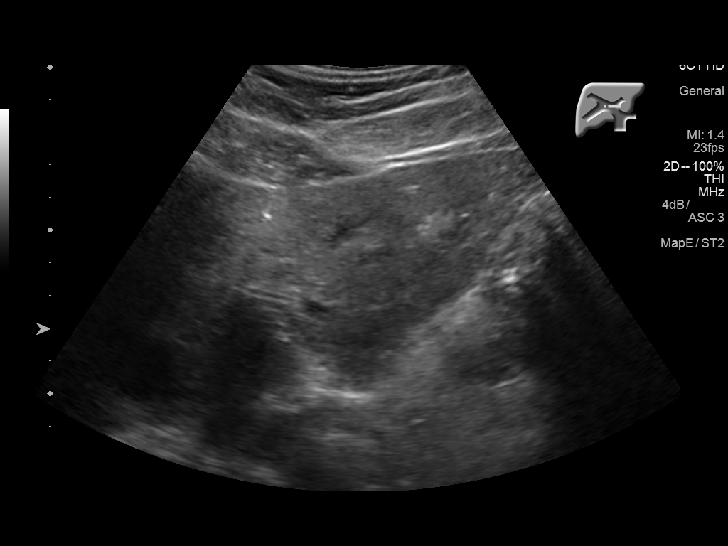
[im 57/106]
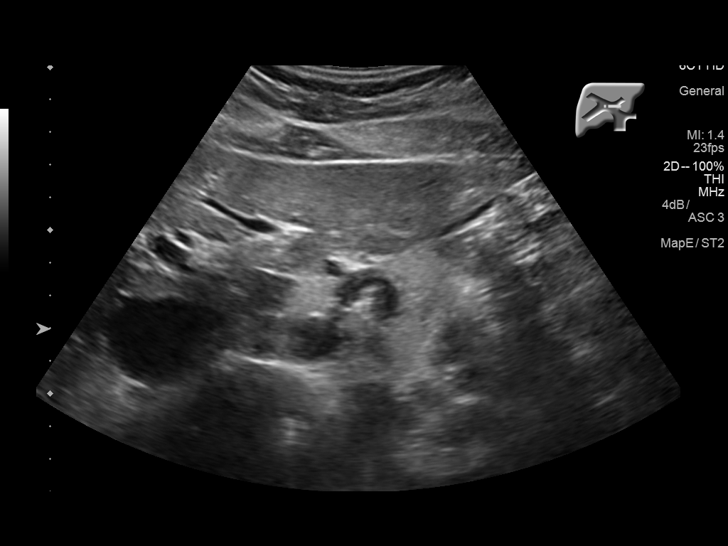
[im 66/106]
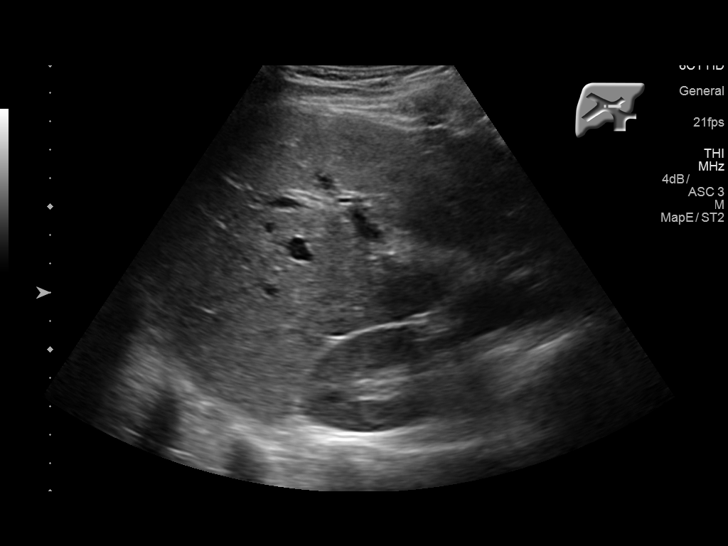
[im 71/106]
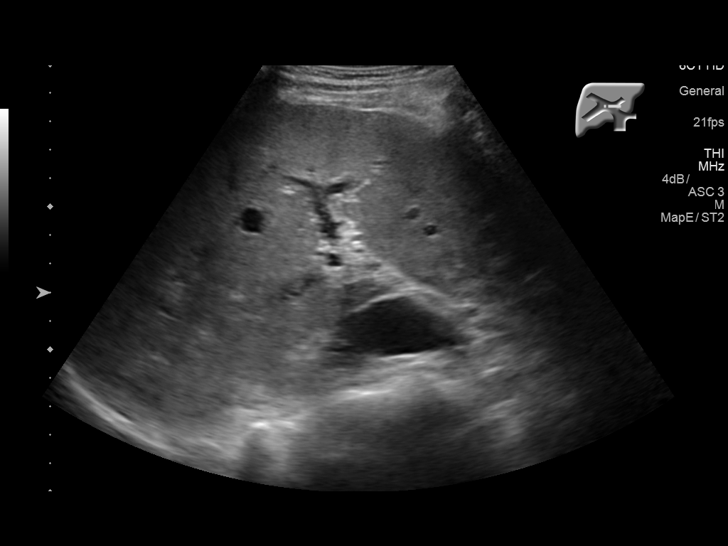
[im 79/106]
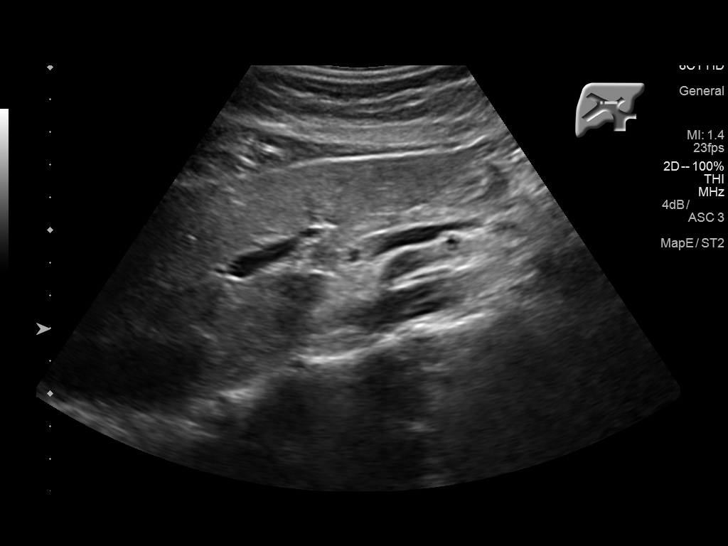
[im 88/106]
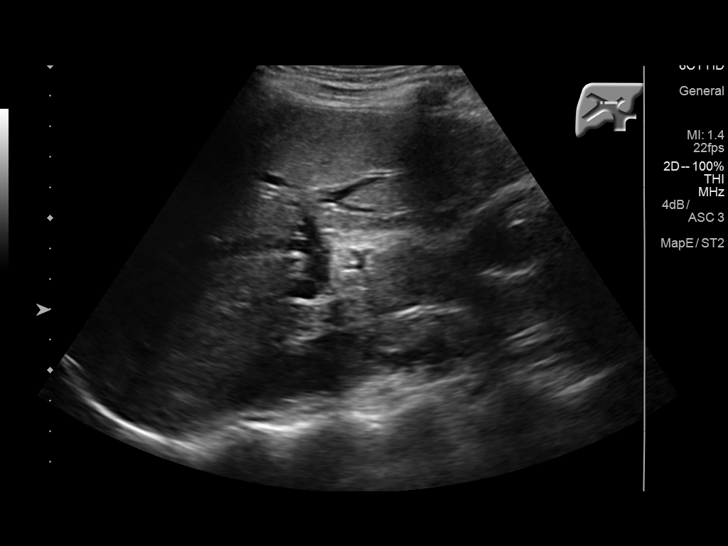
[im 97/106]
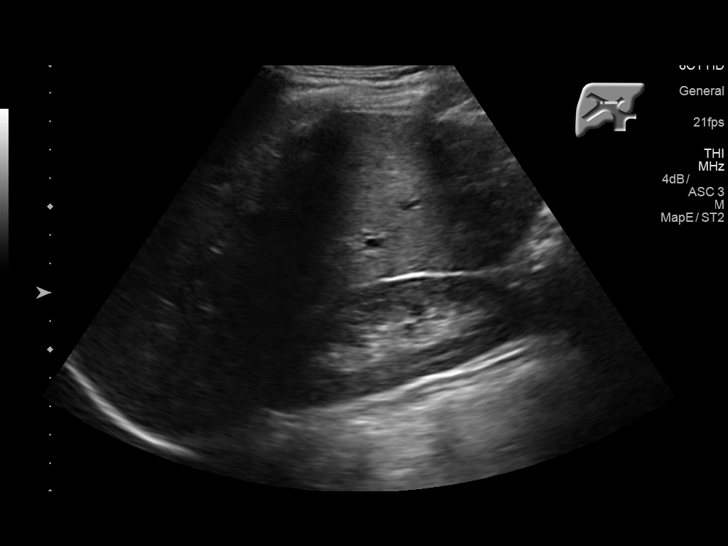
[im 106/106]
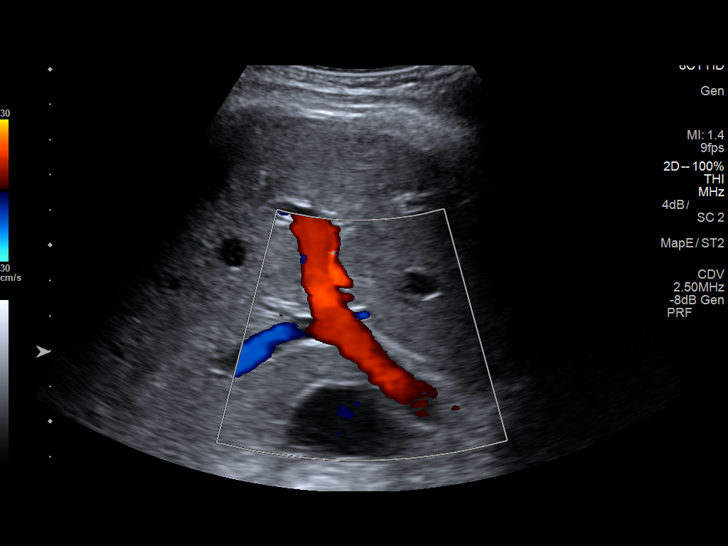

[14 of 25 positions shown; findings below may reference images not displayed]

FINDINGS: Gallbladder:

No gallstones or wall thickening visualized. No sonographic Murphy
sign noted by sonographer.

Common bile duct:

Diameter:  3 mm

Liver:

No focal lesion identified. Within normal limits in parenchymal
echogenicity. Hepatopetal portal vein.
IMPRESSION: Normal RIGHT upper quadrant ultrasound.

## 2018-08-14 LAB — OB RESULTS CONSOLE GBS: GBS: NEGATIVE

## 2018-09-03 ENCOUNTER — Encounter (HOSPITAL_COMMUNITY): Payer: Self-pay | Admitting: *Deleted

## 2018-09-03 ENCOUNTER — Telehealth (HOSPITAL_COMMUNITY): Payer: Self-pay | Admitting: *Deleted

## 2018-09-03 NOTE — Telephone Encounter (Signed)
Preadmission screen  

## 2018-09-09 ENCOUNTER — Other Ambulatory Visit (HOSPITAL_COMMUNITY)
Admission: RE | Admit: 2018-09-09 | Discharge: 2018-09-09 | Disposition: A | Discharge status: Home / Self Care | Payer: BC Managed Care – PPO | Source: Ambulatory Visit | Attending: Obstetrics and Gynecology | Admitting: Obstetrics and Gynecology

## 2018-09-09 ENCOUNTER — Other Ambulatory Visit: Payer: Self-pay

## 2018-09-09 ENCOUNTER — Inpatient Hospital Stay (EMERGENCY_DEPARTMENT_HOSPITAL)
Admission: AD | Admit: 2018-09-09 | Discharge: 2018-09-09 | Disposition: A | Discharge status: Home / Self Care | Payer: BC Managed Care – PPO | Source: Home / Self Care | Attending: Obstetrics and Gynecology | Admitting: Obstetrics and Gynecology

## 2018-09-09 ENCOUNTER — Encounter (HOSPITAL_COMMUNITY): Payer: Self-pay

## 2018-09-09 DIAGNOSIS — Z20828 Contact with and (suspected) exposure to other viral communicable diseases: Secondary | ICD-10-CM | POA: Insufficient documentation

## 2018-09-09 DIAGNOSIS — O471 False labor at or after 37 completed weeks of gestation: Secondary | ICD-10-CM | POA: Insufficient documentation

## 2018-09-09 DIAGNOSIS — O479 False labor, unspecified: Secondary | ICD-10-CM

## 2018-09-09 DIAGNOSIS — Z3689 Encounter for other specified antenatal screening: Secondary | ICD-10-CM | POA: Diagnosis not present

## 2018-09-09 DIAGNOSIS — Z3A39 39 weeks gestation of pregnancy: Secondary | ICD-10-CM

## 2018-09-09 LAB — SARS CORONAVIRUS 2 (TAT 6-24 HRS): SARS Coronavirus 2: NEGATIVE

## 2018-09-09 NOTE — Discharge Instructions (Signed)

## 2018-09-09 NOTE — MAU Note (Signed)
Contractions started 3 hours ago-now every 4-4.5 mins.  No VB/LOF.  Had some pink discharge earlier but thinks it was her mucous plug.  + FM.  GBS negative.

## 2018-09-09 NOTE — MAU Note (Signed)
Pt here for PAT covid swab, denies any symptoms 

## 2018-09-09 NOTE — MAU Provider Note (Signed)
None      S: Ms. Sherry Estrada is a 28 y.o. G1P0 at [redacted]w[redacted]d  who presents to MAU today complaining contractions q 4 minutes since about 3 hours ago. She denies vaginal bleeding and LOF. She reports normal fetal movement.    O: BP 129/85 (BP Location: Right Arm)   Pulse 85   Temp 98.5 F (36.9 C)   Resp 17   Ht 5\' 6"  (1.676 m)   Wt 77.8 kg   LMP 12/05/2017   SpO2 100%   BMI 27.69 kg/m  GENERAL: Well-developed, well-nourished female in no acute distress.  HEAD: Normocephalic, atraumatic.  CHEST: Normal effort of breathing, regular heart rate ABDOMEN: Soft, nontender, gravid  Cervical exam:  Dilation: 1.5 Effacement (%): 80 Station: -2 Presentation: Vertex Exam by:: Gavin Pound, CNM   Fetal Monitoring: Baseline: 135 Variability: Moderate Accelerations: Present Decelerations: None Contractions: Irregular Q2-82min   A: SIUP at [redacted]w[redacted]d  Uterine Contractions  Cat I FT  P: -Exam findings discussed. -Patient questions if she should go home. -Reports that she lives 10 minutes away. -Informed that discharge home would be appropriate at this dilation. -Reports she is scheduled for IOL tomorrow night. -Labor Precautions Given. -NST Reactive -Discharged to home in stable condition.  Gavin Pound, CNM 09/09/2018 9:55 PM

## 2018-09-10 ENCOUNTER — Encounter (HOSPITAL_COMMUNITY): Payer: Self-pay

## 2018-09-10 ENCOUNTER — Inpatient Hospital Stay (HOSPITAL_COMMUNITY): Payer: BC Managed Care – PPO | Admitting: Anesthesiology

## 2018-09-10 ENCOUNTER — Inpatient Hospital Stay (HOSPITAL_COMMUNITY)
Admission: AD | Admit: 2018-09-10 | Discharge: 2018-09-12 | DRG: 807 | Disposition: A | Discharge status: Home / Self Care | Payer: BC Managed Care – PPO | Attending: Obstetrics and Gynecology | Admitting: Obstetrics and Gynecology

## 2018-09-10 ENCOUNTER — Other Ambulatory Visit: Payer: Self-pay

## 2018-09-10 DIAGNOSIS — Z3A39 39 weeks gestation of pregnancy: Secondary | ICD-10-CM | POA: Diagnosis not present

## 2018-09-10 DIAGNOSIS — J45909 Unspecified asthma, uncomplicated: Secondary | ICD-10-CM | POA: Diagnosis present

## 2018-09-10 DIAGNOSIS — Z20828 Contact with and (suspected) exposure to other viral communicable diseases: Secondary | ICD-10-CM | POA: Diagnosis present

## 2018-09-10 DIAGNOSIS — O9952 Diseases of the respiratory system complicating childbirth: Secondary | ICD-10-CM | POA: Diagnosis present

## 2018-09-10 DIAGNOSIS — O26893 Other specified pregnancy related conditions, third trimester: Secondary | ICD-10-CM | POA: Diagnosis present

## 2018-09-10 DIAGNOSIS — Z3493 Encounter for supervision of normal pregnancy, unspecified, third trimester: Secondary | ICD-10-CM

## 2018-09-10 LAB — TYPE AND SCREEN
ABO/RH(D): O POS
Antibody Screen: NEGATIVE

## 2018-09-10 LAB — CBC
HCT: 33.2 % — ABNORMAL LOW (ref 36.0–46.0)
Hemoglobin: 10.8 g/dL — ABNORMAL LOW (ref 12.0–15.0)
MCH: 29.5 pg (ref 26.0–34.0)
MCHC: 32.5 g/dL (ref 30.0–36.0)
MCV: 90.7 fL (ref 80.0–100.0)
Platelets: 288 10*3/uL (ref 150–400)
RBC: 3.66 MIL/uL — ABNORMAL LOW (ref 3.87–5.11)
RDW: 14.3 % (ref 11.5–15.5)
WBC: 14.3 10*3/uL — ABNORMAL HIGH (ref 4.0–10.5)
nRBC: 0 % (ref 0.0–0.2)

## 2018-09-10 LAB — ABO/RH: ABO/RH(D): O POS

## 2018-09-10 LAB — RPR: RPR Ser Ql: NONREACTIVE

## 2018-09-10 MED ORDER — ONDANSETRON HCL 4 MG PO TABS: 4.0000 mg | ORAL_TABLET | ORAL | Status: DC | PRN

## 2018-09-10 MED ORDER — OXYCODONE HCL 5 MG PO TABS: 10.0000 mg | ORAL_TABLET | ORAL | Status: DC | PRN

## 2018-09-10 MED ORDER — DIPHENHYDRAMINE HCL 25 MG PO CAPS: 25.0000 mg | ORAL_CAPSULE | Freq: Four times a day (QID) | ORAL | Status: DC | PRN

## 2018-09-10 MED ORDER — SENNOSIDES-DOCUSATE SODIUM 8.6-50 MG PO TABS
2.0000 | ORAL_TABLET | ORAL | Status: DC
  Administered 2018-09-11 (×2): 2 via ORAL
  Filled 2018-09-10 (×2): qty 2

## 2018-09-10 MED ORDER — ONDANSETRON HCL 4 MG/2ML IJ SOLN
4.0000 mg | Freq: Four times a day (QID) | INTRAMUSCULAR | Status: DC | PRN
  Administered 2018-09-10: 4 mg via INTRAVENOUS
  Filled 2018-09-10: qty 2

## 2018-09-10 MED ORDER — PRENATAL MULTIVITAMIN CH
1.0000 | ORAL_TABLET | Freq: Every day | ORAL | Status: DC
  Administered 2018-09-11 – 2018-09-12 (×2): 1 via ORAL
  Filled 2018-09-10 (×2): qty 1

## 2018-09-10 MED ORDER — OXYCODONE-ACETAMINOPHEN 5-325 MG PO TABS: 2.0000 | ORAL_TABLET | ORAL | Status: DC | PRN

## 2018-09-10 MED ORDER — LACTATED RINGERS IV SOLN: 500.0000 mL | Freq: Once | INTRAVENOUS | Status: DC

## 2018-09-10 MED ORDER — MORPHINE SULFATE (PF) 0.5 MG/ML IJ SOLN
INTRAMUSCULAR | Status: AC
  Filled 2018-09-10: qty 10

## 2018-09-10 MED ORDER — SIMETHICONE 80 MG PO CHEW: 80.0000 mg | CHEWABLE_TABLET | ORAL | Status: DC | PRN

## 2018-09-10 MED ORDER — TETANUS-DIPHTH-ACELL PERTUSSIS 5-2.5-18.5 LF-MCG/0.5 IM SUSP: 0.5000 mL | Freq: Once | INTRAMUSCULAR | Status: DC

## 2018-09-10 MED ORDER — LIDOCAINE HCL (PF) 1 % IJ SOLN: 30.0000 mL | INTRAMUSCULAR | Status: DC | PRN

## 2018-09-10 MED ORDER — PHENYLEPHRINE 40 MCG/ML (10ML) SYRINGE FOR IV PUSH (FOR BLOOD PRESSURE SUPPORT): 80.0000 ug | PREFILLED_SYRINGE | INTRAVENOUS | Status: DC | PRN

## 2018-09-10 MED ORDER — OXYTOCIN 40 UNITS IN NORMAL SALINE INFUSION - SIMPLE MED
INTRAVENOUS | Status: AC
  Filled 2018-09-10: qty 1000

## 2018-09-10 MED ORDER — DIPHENHYDRAMINE HCL 50 MG/ML IJ SOLN: 12.5000 mg | INTRAMUSCULAR | Status: DC | PRN

## 2018-09-10 MED ORDER — DIBUCAINE (PERIANAL) 1 % EX OINT: 1.0000 "application " | TOPICAL_OINTMENT | CUTANEOUS | Status: DC | PRN

## 2018-09-10 MED ORDER — OXYTOCIN 40 UNITS IN NORMAL SALINE INFUSION - SIMPLE MED
1.0000 m[IU]/min | INTRAVENOUS | Status: DC
  Administered 2018-09-10: 2 m[IU]/min via INTRAVENOUS
  Filled 2018-09-10: qty 1000

## 2018-09-10 MED ORDER — LACTATED RINGERS IV SOLN: 500.0000 mL | INTRAVENOUS | Status: DC | PRN

## 2018-09-10 MED ORDER — PHENYLEPHRINE HCL-NACL 20-0.9 MG/250ML-% IV SOLN
INTRAVENOUS | Status: AC
  Filled 2018-09-10: qty 250

## 2018-09-10 MED ORDER — WITCH HAZEL-GLYCERIN EX PADS: 1.0000 "application " | MEDICATED_PAD | CUTANEOUS | Status: DC | PRN

## 2018-09-10 MED ORDER — ONDANSETRON HCL 4 MG/2ML IJ SOLN
INTRAMUSCULAR | Status: AC
  Filled 2018-09-10: qty 2

## 2018-09-10 MED ORDER — FENTANYL CITRATE (PF) 100 MCG/2ML IJ SOLN
INTRAMUSCULAR | Status: DC | PRN
  Administered 2018-09-10 (×2): 50 ug via EPIDURAL

## 2018-09-10 MED ORDER — EPHEDRINE 5 MG/ML INJ: 10.0000 mg | INTRAVENOUS | Status: DC | PRN

## 2018-09-10 MED ORDER — TERBUTALINE SULFATE 1 MG/ML IJ SOLN: 0.2500 mg | Freq: Once | INTRAMUSCULAR | Status: DC | PRN

## 2018-09-10 MED ORDER — ACETAMINOPHEN 325 MG PO TABS
650.0000 mg | ORAL_TABLET | ORAL | Status: DC | PRN
  Administered 2018-09-10 – 2018-09-11 (×3): 650 mg via ORAL
  Filled 2018-09-10 (×3): qty 2

## 2018-09-10 MED ORDER — METHYLERGONOVINE MALEATE 0.2 MG PO TABS: 0.2000 mg | ORAL_TABLET | ORAL | Status: DC | PRN

## 2018-09-10 MED ORDER — FENTANYL CITRATE (PF) 100 MCG/2ML IJ SOLN
INTRAMUSCULAR | Status: AC
  Filled 2018-09-10: qty 2

## 2018-09-10 MED ORDER — LACTATED RINGERS IV SOLN
INTRAVENOUS | Status: DC
  Administered 2018-09-10 (×3): via INTRAVENOUS

## 2018-09-10 MED ORDER — OXYCODONE HCL 5 MG PO TABS: 5.0000 mg | ORAL_TABLET | ORAL | Status: DC | PRN

## 2018-09-10 MED ORDER — COCONUT OIL OIL: 1.0000 "application " | TOPICAL_OIL | Status: DC | PRN

## 2018-09-10 MED ORDER — LIDOCAINE HCL (PF) 1 % IJ SOLN
INTRAMUSCULAR | Status: DC | PRN
  Administered 2018-09-10 (×2): 5 mL via EPIDURAL

## 2018-09-10 MED ORDER — BENZOCAINE-MENTHOL 20-0.5 % EX AERO
1.0000 "application " | INHALATION_SPRAY | CUTANEOUS | Status: DC | PRN
  Administered 2018-09-11: 1 via TOPICAL
  Filled 2018-09-10: qty 56

## 2018-09-10 MED ORDER — SOD CITRATE-CITRIC ACID 500-334 MG/5ML PO SOLN: 30.0000 mL | ORAL | Status: DC | PRN

## 2018-09-10 MED ORDER — BUTORPHANOL TARTRATE 1 MG/ML IJ SOLN
1.0000 mg | INTRAMUSCULAR | Status: DC | PRN
  Administered 2018-09-10: 1 mg via INTRAVENOUS
  Filled 2018-09-10: qty 1

## 2018-09-10 MED ORDER — METHYLERGONOVINE MALEATE 0.2 MG/ML IJ SOLN: 0.2000 mg | INTRAMUSCULAR | Status: DC | PRN

## 2018-09-10 MED ORDER — PHENYLEPHRINE 40 MCG/ML (10ML) SYRINGE FOR IV PUSH (FOR BLOOD PRESSURE SUPPORT)
80.0000 ug | PREFILLED_SYRINGE | INTRAVENOUS | Status: DC | PRN
  Filled 2018-09-10: qty 10

## 2018-09-10 MED ORDER — OXYTOCIN 40 UNITS IN NORMAL SALINE INFUSION - SIMPLE MED: 2.5000 [IU]/h | INTRAVENOUS | Status: DC

## 2018-09-10 MED ORDER — SODIUM CHLORIDE (PF) 0.9 % IJ SOLN
INTRAMUSCULAR | Status: DC | PRN
  Administered 2018-09-10: 12 mL/h via EPIDURAL

## 2018-09-10 MED ORDER — BUPIVACAINE HCL (PF) 0.25 % IJ SOLN
INTRAMUSCULAR | Status: DC | PRN
  Administered 2018-09-10 (×2): 4 mL via EPIDURAL

## 2018-09-10 MED ORDER — OXYCODONE-ACETAMINOPHEN 5-325 MG PO TABS: 1.0000 | ORAL_TABLET | ORAL | Status: DC | PRN

## 2018-09-10 MED ORDER — IBUPROFEN 600 MG PO TABS
600.0000 mg | ORAL_TABLET | Freq: Four times a day (QID) | ORAL | Status: DC
  Administered 2018-09-10 – 2018-09-12 (×7): 600 mg via ORAL
  Filled 2018-09-10 (×8): qty 1

## 2018-09-10 MED ORDER — ONDANSETRON HCL 4 MG/2ML IJ SOLN: 4.0000 mg | INTRAMUSCULAR | Status: DC | PRN

## 2018-09-10 MED ORDER — OXYTOCIN BOLUS FROM INFUSION
500.0000 mL | Freq: Once | INTRAVENOUS | Status: AC
  Administered 2018-09-10: 18:00:00 500 mL via INTRAVENOUS

## 2018-09-10 MED ORDER — NALBUPHINE HCL 10 MG/ML IJ SOLN
10.0000 mg | Freq: Once | INTRAMUSCULAR | Status: AC
  Administered 2018-09-10: 03:00:00 10 mg via INTRAMUSCULAR
  Filled 2018-09-10: qty 1

## 2018-09-10 MED ORDER — ACETAMINOPHEN 325 MG PO TABS: 650.0000 mg | ORAL_TABLET | ORAL | Status: DC | PRN

## 2018-09-10 MED ORDER — FENTANYL-BUPIVACAINE-NACL 0.5-0.125-0.9 MG/250ML-% EP SOLN
12.0000 mL/h | EPIDURAL | Status: DC | PRN
  Filled 2018-09-10: qty 250

## 2018-09-10 MED ORDER — ZOLPIDEM TARTRATE 5 MG PO TABS
5.0000 mg | ORAL_TABLET | Freq: Every evening | ORAL | Status: DC | PRN
  Administered 2018-09-10 – 2018-09-11 (×2): 5 mg via ORAL
  Filled 2018-09-10 (×2): qty 1

## 2018-09-10 NOTE — Progress Notes (Signed)
Patient ID: Sherry Estrada, female   DOB: [birth year], 28 y.o.   MRN: [id number] Late entry Pt comfortable with epidural. +Fms VSS EFM - cat 1 TOCO - ctxs q 3-36mins SVE - 7-8cm   A/P: Prime at 22 6/7wks progressing well in labor on pitocin         Continue with current mgmt          Anticipate svd

## 2018-09-10 NOTE — Progress Notes (Signed)
Patient ID: Sherry Estrada, female   DOB: 04-Jan-1991, 28 y.o.   MRN: [id number] Pt comfortable with epidural. No complaints. +Fms VSS EFM - 125, cat 1 TOCO - ctxs q 65mins SVE - 6/100/-1  A/P: Prime progressing in labor on pitocin at 31mus          AROM - individual globs of meconium noted          Anticipate svd

## 2018-09-10 NOTE — H&P (Signed)
Sherry Estrada is a 28 y.o. female G1P0 at 39+ for IOL, presented x 2 to MAU with painful uterine contractions.  Relatively uncomplicated PNC - Pregnancy complicated by asthma, ADD, h/o depression.  Pregnancy dated by LMP.  Tdap given 6/8.  OB History    Gravida  1   Para      Term      Preterm      AB      Living        SAB      TAB      Ectopic      Multiple      Live Births            G1 present + abn pap = ASCUS H RHPV+ - colpo w pregnanct - cotest PP No STD  Past Medical History:  Diagnosis Date  . ADD (attention deficit disorder)   . Allergy   . Asthma   . Chronic sinusitis   . Hypokalemia   . PONV (postoperative nausea and vomiting)    Past Surgical History:  Procedure Laterality Date  . APPENDECTOMY    . NASAL SINUS SURGERY Bilateral 10/29/2012   Procedure: BILATERAL ENDOSCOPIC SINUS SURGERY/BILATERAL PARTIAL INFERIOR TURBINATE RESECTION;  Surgeon: Darletta MollSui W Teoh, MD;  Location: [facility];  Service: ENT;  Laterality: Bilateral;  . TURBINATE REDUCTION Bilateral 10/29/2012   Procedure: BILATERAL ENDOSCOPIC TOTAL ETHMOIDECTOMY/ BILATERAL MAXILLARY ANTROSTOMY ;  Surgeon: Darletta MollSui W Teoh, MD;  Location: [facility];  Service: ENT;  Laterality: Bilateral;  . WISDOM TOOTH EXTRACTION     Family History: family history includes Birth defects in her brother; Cancer in her maternal grandfather; Cancer (age of onset: 8025) in her mother; Heart disease in her paternal grandfather. Social History:  reports that she has never smoked. She has never used smokeless tobacco. She reports current alcohol use. She reports that she does not use drugs.  Teacher, single  Meds albuterol, adderall, PNV, Xyzal, Wixela, Anucort All NKDA, tree nuts     Maternal Diabetes: No Genetic Screening: Declined Maternal Ultrasounds/Referrals: Normal Fetal Ultrasounds or other Referrals:  None Maternal Substance Abuse:  No Significant Maternal Medications:   None Significant Maternal Lab Results:  Group B Strep negative Other Comments:  None  Review of Systems  Constitutional: Negative.   HENT: Negative.   Eyes: Negative.   Respiratory: Negative.   Cardiovascular: Negative.   Gastrointestinal: Negative.   Genitourinary: Negative.   Musculoskeletal: Negative.   Skin: Negative.   Neurological: Negative.   Psychiatric/Behavioral: Negative.    Maternal Medical History:  Reason for admission: Contractions.   Contractions: Onset was 13-24 hours ago.   Frequency: regular.    Fetal activity: Perceived fetal activity is normal.    Prenatal Complications - Diabetes: none.    Dilation: 2 Effacement (%): 80 Station: -1 Exam by:: Ivory BroadM Kelly, RN Blood pressure 110/72, pulse 91, temperature (!) 97.1 F (36.2 C), temperature source Axillary, resp. rate 16, last menstrual period 12/05/2017, SpO2 100 %. Maternal Exam:  Uterine Assessment: Contraction strength is firm.  Contraction frequency is regular.   Abdomen: Patient reports no abdominal tenderness. Fundal height is appropriate for gestation.    Introitus: Normal vulva. Normal vagina.    Physical Exam  Constitutional: She is oriented to person, place, and time. She appears well-developed and well-nourished.  HENT:  Head: Normocephalic and atraumatic.  Cardiovascular: Normal rate and regular rhythm.  Respiratory: Effort normal and breath sounds normal. No respiratory distress. She has  no wheezes.  GI: Soft. Bowel sounds are normal. She exhibits no distension. There is no abdominal tenderness.  Genitourinary:    Vulva normal.   Musculoskeletal: Normal range of motion.  Neurological: She is alert and oriented to person, place, and time.  Skin: Skin is warm and dry.  Psychiatric: She has a normal mood and affect. Her behavior is normal.    Prenatal labs: ABO, Rh: --/--/O POS, O POS Performed at [facility], [place] [address], [place], [place] [address]  [phone]) Antibody:  NEG (08/10 7048) Rubella: Immune (01/15 0000) RPR: Nonreactive (01/15 0000)  HBsAg: Negative (01/15 0000)  HIV: Non-reactive (01/15 0000)  GBS: Negative (07/14 0000)  Hgb 13.6/Plt 307/Ur Cx neg/Chl neg/ GC neg/Varicella nonimmune/glucola 136 - nl 3 hr/  Tdap 6/8  Assessment/Plan: 28yo G1 in latent labor GBBS neg Augment/iol w pitocin and AROM Epidural/IV pain meds for pain control Expect SVD   Kieon Lawhorn Bovard-Stuckert 09/10/2018, 7:14 AM

## 2018-09-10 NOTE — Anesthesia Procedure Notes (Signed)
Epidural Patient location during procedure: OB Start time: 09/10/2018 7:40 AM End time: 09/10/2018 7:43 AM  Staffing Anesthesiologist: Lyn Hollingshead, MD Performed: anesthesiologist   Preanesthetic Checklist Completed: patient identified, site marked, surgical consent, pre-op evaluation, timeout performed, IV checked, risks and benefits discussed and monitors and equipment checked  Epidural Patient position: sitting Prep: site prepped and draped and DuraPrep Patient monitoring: continuous pulse ox and blood pressure Approach: midline Location: L3-L4 Injection technique: LOR air  Needle:  Needle type: Tuohy  Needle gauge: 17 G Needle length: 9 cm and 9 Needle insertion depth: 5 cm cm Catheter type: closed end flexible Catheter size: 19 Gauge Catheter at skin depth: 10 cm Test dose: negative and Other  Assessment Events: blood not aspirated, injection not painful, no injection resistance, negative IV test and no paresthesia  Additional Notes Reason for block:procedure for pain

## 2018-09-10 NOTE — MAU Note (Signed)
Contractions have gotten much stronger-still around 4-5 mins apart.  No LOF/VB.  + FM.

## 2018-09-10 NOTE — Anesthesia Preprocedure Evaluation (Signed)
Anesthesia Evaluation  Patient identified by MRN, date of birth, ID band Patient awake    Reviewed: Allergy & Precautions, H&P , NPO status , Patient's Chart, lab work & pertinent test results  History of Anesthesia Complications (+) history of anesthetic complications  Airway Mallampati: I  TM Distance: >3 FB Neck ROM: full    Dental no notable dental hx.    Pulmonary asthma ,    Pulmonary exam normal breath sounds clear to auscultation       Cardiovascular negative cardio ROS Normal cardiovascular exam Rhythm:regular Rate:Normal     Neuro/Psych negative neurological ROS     GI/Hepatic Neg liver ROS, GERD  Medicated,  Endo/Other  negative endocrine ROS  Renal/GU negative Renal ROS  negative genitourinary   Musculoskeletal negative musculoskeletal ROS (+)   Abdominal Normal abdominal exam  (+)   Peds  Hematology negative hematology ROS (+)   Anesthesia Other Findings   Reproductive/Obstetrics (+) Pregnancy                             Anesthesia Physical Anesthesia Plan  ASA: II  Anesthesia Plan: Epidural   Post-op Pain Management:    Induction:   PONV Risk Score and Plan:   Airway Management Planned:   Additional Equipment:   Intra-op Plan:   Post-operative Plan:   Informed Consent: I have reviewed the patients History and Physical, chart, labs and discussed the procedure including the risks, benefits and alternatives for the proposed anesthesia with the patient or authorized representative who has indicated his/her understanding and acceptance.       Plan Discussed with:   Anesthesia Plan Comments:         Anesthesia Quick Evaluation

## 2018-09-10 NOTE — Progress Notes (Signed)
Mom is pumping. Rn set up an electric pump.

## 2018-09-11 ENCOUNTER — Inpatient Hospital Stay (HOSPITAL_COMMUNITY): Payer: BC Managed Care – PPO

## 2018-09-11 LAB — CBC
HCT: 27.9 % — ABNORMAL LOW (ref 36.0–46.0)
Hemoglobin: 8.9 g/dL — ABNORMAL LOW (ref 12.0–15.0)
MCH: 29.4 pg (ref 26.0–34.0)
MCHC: 31.9 g/dL (ref 30.0–36.0)
MCV: 92.1 fL (ref 80.0–100.0)
Platelets: 258 10*3/uL (ref 150–400)
RBC: 3.03 MIL/uL — ABNORMAL LOW (ref 3.87–5.11)
RDW: 14.5 % (ref 11.5–15.5)
WBC: 19.6 10*3/uL — ABNORMAL HIGH (ref 4.0–10.5)
nRBC: 0 % (ref 0.0–0.2)

## 2018-09-11 MED ORDER — PNEUMOCOCCAL VAC POLYVALENT 25 MCG/0.5ML IJ INJ: 0.5000 mL | INJECTION | INTRAMUSCULAR | Status: DC

## 2018-09-11 NOTE — Lactation Note (Signed)
This note was copied from a baby's chart. Lactation Consultation Note  Patient Name: Sherry Estrada Today's Date: 09/11/2018   P1, 8 hour female infant, in NICU. LC entered room Mom is asleep at this time.  Mom has been set up with DEBP according to Nurse Lennie Muckle).   Maternal Data    Feeding Feeding Type: Bottle Fed - Formula Nipple Type: Nfant Slow Flow (purple)  LATCH Score                   Interventions    Lactation Tools Discussed/Used     Consult Status      Vicente Serene 09/11/2018, 1:41 AM

## 2018-09-11 NOTE — Anesthesia Postprocedure Evaluation (Signed)
Anesthesia Post Note  Patient: Sherry Estrada  Procedure(s) Performed: AN AD HOC LABOR EPIDURAL     Patient location during evaluation: Mother Baby Anesthesia Type: Epidural Level of consciousness: awake and alert Pain management: pain level controlled Vital Signs Assessment: post-procedure vital signs reviewed and stable Respiratory status: spontaneous breathing, nonlabored ventilation and respiratory function stable Cardiovascular status: stable Postop Assessment: no headache, no backache and epidural receding Anesthetic complications: no    Last Vitals:  Vitals:   09/10/18 2209 09/11/18 0336  BP: 109/70 110/74  Pulse: 74 71  Resp: 18 18  Temp: 37.4 C 36.8 C  SpO2:      Last Pain:  Vitals:   09/11/18 0336  TempSrc: Oral  PainSc:    Pain Goal:                   Jarrett Chicoine

## 2018-09-11 NOTE — Plan of Care (Signed)
  Problem: Education: Goal: Knowledge of condition will improve Outcome: Completed/Met   Problem: Activity: Goal: Will verbalize the importance of balancing activity with adequate rest periods Outcome: Completed/Met Goal: Ability to tolerate increased activity will improve Outcome: Completed/Met   Problem: Life Cycle: Goal: Chance of risk for complications during the postpartum period will decrease Outcome: Completed/Met   Problem: Education: Goal: Knowledge of General Education information will improve Description: Including pain rating scale, medication(s)/side effects and non-pharmacologic comfort measures Outcome: Completed/Met   Problem: Clinical Measurements: Goal: Ability to maintain clinical measurements within normal limits will improve Outcome: Completed/Met Goal: Diagnostic test results will improve Outcome: Completed/Met Goal: Respiratory complications will improve Outcome: Completed/Met Goal: Cardiovascular complication will be avoided Outcome: Completed/Met   Problem: Activity: Goal: Risk for activity intolerance will decrease Outcome: Completed/Met   Problem: Nutrition: Goal: Adequate nutrition will be maintained Outcome: Completed/Met   Problem: Pain Managment: Goal: General experience of comfort will improve Outcome: Completed/Met

## 2018-09-11 NOTE — Progress Notes (Signed)
Post Partum Day 1 Subjective: no complaints, up ad lib and tolerating PO  Normal lochia  Objective: Blood pressure 109/76, pulse 75, temperature 98.4 F (36.9 C), temperature source Oral, resp. rate 18, last menstrual period 12/05/2017, SpO2 99 %, unknown if currently breastfeeding.  Physical Exam:  General: alert and cooperative Lochia: appropriate Uterine Fundus: firm    Recent Labs    09/10/18 0519 09/11/18 0550  HGB 10.8* 8.9*  HCT 33.2* 27.9*    Assessment/Plan: Doing well Baby in NICU for some respiratory issues and low BS, stable    LOS: 1 day   Logan Bores 09/11/2018, 10:21 AM

## 2018-09-11 NOTE — Progress Notes (Signed)
CSW acknowledged consulted and attempted to meet with MOB.  MOB was headed to the NICU and requested that CSW return tomorrow to complete clinical assessment; CSW agreed.   Najeh Credit Boyd-Gilyard, MSW, LCSW Clinical Social Work [phone]  

## 2018-09-12 MED ORDER — IBUPROFEN 600 MG PO TABS: 600.0000 mg | ORAL_TABLET | Freq: Four times a day (QID) | ORAL | 0 refills | Status: AC

## 2018-09-12 NOTE — Lactation Note (Signed)
This note was copied from a baby's chart. Lactation Consultation : Infant is 78 hours old and is in the NICU, Infant Term and LGA . infnat  had Resp distress,  And  hypoglycemia at delivery. Staff Nurse sat up DEBP after del. Mother has been pumping every 2-3 hours for 15 mins.   Mother last pumped at 10:50. She pumped approx 8-10 ml.  Reviewed hand expression with mother. Observed large flow of colostrum.   Mother was given Breastfeeding Your NICU Baby. She has breastmilk labels. She was given yellow colostrum dots with extra collection bottles.   Encouraged mother to continue to hand express before and after each pumping. Advised mother to continue to pump every 2-3 hours for at least 15-20 mins. Mother has a Medela pump at home.  Discussed collection, storage and transporting of NICU milk .  Mother was also given Lactation Brochure with information on available LC services at [facility].  Recommend that mother have NICU nurse to call [facility] to schedule appt for [facility] at infants bedside, or mother to phone [facility] office.   Mother is unsure when she will be available. She reports that she will return later this evening but unsure if she will stay the night.  Teaching at mothers bedside on the use of a NS. Mother brought her own NS with her. Recommend that she have assistance with NS placement. Instruct in proper use of the nipple shield.   Mother reports that she was told that infant has a tongue tie. Mother reports that's she as well has a tongue tie. Discussed that Peds could assess and discuss with her. Infant is being tube fed as well as taking a bottle.  Speech to see infant at the bedside.  Mother receptive to all teaching.   Patient Name: Sherry Estrada NIOEV'O Date: 09/12/2018 Reason for consult: Initial assessment   Maternal Data    Feeding Feeding Type: Breast Milk with Formula added  LATCH Score                   Interventions Interventions: Hand express;Breast  compression;Adjust position;Support pillows;Position options;Expressed milk;DEBP(discussed all topics)  Lactation Tools Discussed/Used Pump Review: Setup, frequency, and cleaning;Milk Storage;Other (comment) Initiated by:: Staff Nurse Date initiated:: 09/10/18   Consult Status Consult Status: Follow-up Date: 09/13/18 Follow-up type: In-patient    Jess Barters [facility] 09/12/2018, 11:44 AM

## 2018-09-12 NOTE — Progress Notes (Signed)
PPD #2 Doing we,,. Baby improving in NICU Afeb, VSS Fundus firm D/c home

## 2018-09-12 NOTE — Discharge Instructions (Signed)
As per discharge pamphlet °

## 2018-09-12 NOTE — Discharge Summary (Signed)
OB Discharge Summary     Patient Name: Sherry DebarBrooke R Quiocho DOB: 02-06-1990 MRN: [id number]  Date of admission: 09/10/2018 Delivering MD: Pryor OchoaBANGA, CECILIA [facility]   Date of discharge: 09/12/2018  Admitting diagnosis: 40WKS CTX Intrauterine pregnancy: 10880w6d     Secondary diagnosis:  Active Problems:   Normal pregnancy, third trimester      Discharge diagnosis: Term Pregnancy Delivered                                  Hospital course:  Onset of Labor With Vaginal Delivery     28 y.o. yo G1P1001 at 2180w6d was admitted in Latent Labor with SROM on 09/10/2018. Patient had an uncomplicated labor course as follows:  Membrane Rupture Time/Date: 9:49 AM ,09/10/2018   Intrapartum Procedures: Episiotomy: None [1]                                         Lacerations:  1st degree [2];Labial [10]  Patient had a delivery of a Viable infant. 09/10/2018  Information for the patient's newborn:  Lucilla EdinDavis, Boy Ariadne [[id number]][[id number]]       Pateint had an uncomplicated postpartum course.  Baby in the NICU for hypoglycemia.  She is ambulating, tolerating a regular diet, passing flatus, and urinating well. Patient is discharged home in stable condition on 09/12/18.   Physical exam  Vitals:   09/11/18 0745 09/11/18 1330 09/11/18 2207 09/12/18 0529  BP: 109/76 112/83 118/84 107/74  Pulse: 75 84 81 85  Resp: 18 16 20 16   Temp: 98.4 F (36.9 C) 99.2 F (37.3 C)  98.4 F (36.9 C)  TempSrc: Oral Oral  Oral  SpO2: 99%  99% 99%   General: alert Lochia: appropriate Uterine Fundus: firm  Labs: Lab Results  Component Value Date   WBC 19.6 (H) 09/11/2018   HGB 8.9 (L) 09/11/2018   HCT 27.9 (L) 09/11/2018   MCV 92.1 09/11/2018   PLT 258 09/11/2018   CMP Latest Ref Rng & Units 11/23/2016  Glucose 65 - 99 mg/dL 95  BUN 6 - 20 mg/dL 7  Creatinine 7.820.57 - 9.561.00 mg/dL 2.130.80  Sodium 086134 - 578144 mmol/L 139  Potassium 3.5 - 5.2 mmol/L 4.7  Chloride 96 - 106 mmol/L 100  CO2 20 - 29 mmol/L 23  Calcium 8.7 - 10.2 mg/dL 9.5   Total Protein 6.0 - 8.5 g/dL 7.3  Total Bilirubin 0.0 - 1.2 mg/dL 0.3  Alkaline Phos 39 - 117 IU/L 69  AST 0 - 40 IU/L 17  ALT 0 - 32 IU/L 8    Discharge instruction: per After Visit Summary and "Baby and Me Booklet".  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       Medication List    STOP taking these medications   ampicillin 500 MG capsule Commonly known as: PRINCIPEN   pantoprazole 40 MG tablet Commonly known as: PROTONIX   sucralfate 1 g tablet Commonly known as: Carafate     TAKE these medications   albuterol 108 (90 Base) MCG/ACT inhaler Commonly known as: Proventil HFA INHALE 1 PUFF INTO THE LUNGS EVERY 6 HOURS AS NEEDED FOR WHEEZING   albuterol 108 (90 Base) MCG/ACT inhaler Commonly  known as: VENTOLIN HFA INHALE 1 PUFF INTO THE LUNGS EVERY 6 HOURS AS NEEDED FOR WHEEZING   amphetamine-dextroamphetamine 20 MG tablet Commonly known as: ADDERALL Take 20 mg by mouth daily.   amphetamine-dextroamphetamine 25 MG 24 hr capsule Commonly known as: ADDERALL XR Take 1 capsule by mouth every morning.   Fluticasone-Salmeterol 250-50 MCG/DOSE Aepb Commonly known as: Advair Diskus INHALE 1 PUFF BY MOUTH EVERY 12 HOURS   ibuprofen 600 MG tablet Commonly known as: ADVIL Take 1 tablet (600 mg total) by mouth every 6 (six) hours.   montelukast 10 MG tablet Commonly known as: SINGULAIR TAKE 1 TABLET BY MOUTH EVERY NIGHT AT BEDTIME   tretinoin 0.025 % cream Commonly known as: RETIN-A APPLY AA OF FACE QPM       Diet: routine diet  Activity: Advance as tolerated. Pelvic rest for 6 weeks.   Outpatient follow up:6 weeks  Newborn Data: Live born female  Birth Weight: 9 lb 14.2 oz (4485 g) APGAR: 7, 8  Newborn Delivery   Birth date/time: 09/10/2018 17:33:00 Delivery type: Vaginal, Spontaneous      Baby Feeding:  Breast Disposition:NICU   09/12/2018 Clarene Duke, MD

## 2018-09-12 NOTE — Clinical Social Work Maternal (Signed)
CLINICAL SOCIAL WORK MATERNAL/CHILD NOTE  Patient Details  Name: Sherry Estrada MRN: [id number] Date of Birth: 1990-11-27  Date:  09/12/2018  Clinical Social Worker Initiating Note:  Laurey Arrow Date/Time: Initiated:  09/12/18/1055     Child's Name:  Sherry Estrada   Biological Parents:  Mother, Father(Per MOB, FOB is Sherry Estrada age 30 and he resides in [place].)   Need for Interpreter:  None   Reason for Referral:  Behavioral Health Concerns   Address:  [address] 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 [employer]   Education:  [place] arranged:    Museum/gallery curator Resources:  Multimedia programmer   Other Resources:      Cultural/Religious Considerations Which May Impact Care:  None reported  Strengths:  Ability to meet basic needs , Engineer, materials, Home prepared for child    Psychotropic Medications:         Pediatrician:    [place] area  Pediatrician List:   [place]      Pediatrician Fax Number:    Risk Factors/Current Problems:      Cognitive State:  Alert , Insightful , Linear Thinking , Goal Oriented    Mood/Affect:  Happy , Bright , Interested , Relaxed    CSW Assessment: CSW met with MOB in room 408 to complete an assessment for MH hx.  When CSW arrived to MOB's room MOB was resting in  bed and infant was in the NICU. CSW explained CSW's role and invited MOB to ask questions. MOB was polite, easy to engage and was receptive to meeting with CSW.   CSW asked about MOB's thoughts and feelings regarding infant's NICU admission and MOB said "I feel fine. He's doing."  MOB reported having a good understanding of infant's health and knew about the NICU visitation. policy.   CSW asked about MOB's MH hx and MOB denied a hx of anxiety and depression. CSW utilized the opportunity to educate MOB about PMADs and MOB was receptive. CSW provided education regarding the baby blues period vs. perinatal mood disorders, discussed treatment and gave resources for mental health follow up if concerns arise.  CSW recommends self-evaluation during the postpartum time period using the New Mom Checklist from Postpartum Progress and encouraged MOB to contact a medical professional if symptoms are noted at any time.  CSW assessed for safety and MOB denied SI, HI, and DV.   CSW provided review of Sudden Infant Death Syndrome (SIDS) precautions.    CSW will continue to offer resources and supports  to family while infant remains in the NICU.   CSW Plan/Description:  Psychosocial Support and Ongoing Assessment of Needs, Perinatal Mood and Anxiety Disorder (PMADs) Education, Sudden Infant Death Syndrome (SIDS) Education, Other Information/Referral to [employer], MSW, [employer] Social Work [phone]   Dimple Nanas, LCSW 09/12/2018, 10:57 AM
